# Patient Record
Sex: Male | Born: 1945 | Race: Black or African American | Hispanic: No | Marital: Married | Smoking: Never smoker
Health system: Southern US, Community
[De-identification: ages and names within clinical notes are randomized; demographics above are authoritative.]

## PROBLEM LIST (undated history)

## (undated) DIAGNOSIS — H269 Unspecified cataract: Secondary | ICD-10-CM

## (undated) DIAGNOSIS — R7989 Other specified abnormal findings of blood chemistry: Secondary | ICD-10-CM

## (undated) DIAGNOSIS — N400 Enlarged prostate without lower urinary tract symptoms: Secondary | ICD-10-CM

## (undated) DIAGNOSIS — I10 Essential (primary) hypertension: Secondary | ICD-10-CM

## (undated) DIAGNOSIS — D649 Anemia, unspecified: Secondary | ICD-10-CM

## (undated) HISTORY — DX: Other specified abnormal findings of blood chemistry: R79.89

## (undated) HISTORY — PX: JOINT REPLACEMENT: SHX530

## (undated) HISTORY — PX: REPLACEMENT TOTAL KNEE: SUR1224

## (undated) HISTORY — PX: OTHER SURGICAL HISTORY: SHX169

## (undated) HISTORY — DX: Unspecified cataract: H26.9

---

## 2007-09-26 ENCOUNTER — Encounter: Payer: Self-pay | Admitting: Emergency Medicine

## 2007-09-27 ENCOUNTER — Ambulatory Visit: Payer: Self-pay | Admitting: Cardiology

## 2007-09-27 ENCOUNTER — Encounter (INDEPENDENT_AMBULATORY_CARE_PROVIDER_SITE_OTHER): Payer: Self-pay | Admitting: Internal Medicine

## 2007-09-27 ENCOUNTER — Ambulatory Visit: Payer: Self-pay | Admitting: Vascular Surgery

## 2007-09-27 ENCOUNTER — Inpatient Hospital Stay (HOSPITAL_COMMUNITY): Admission: EM | Admit: 2007-09-27 | Discharge: 2007-09-28 | Payer: Self-pay | Admitting: Internal Medicine

## 2010-12-01 NOTE — Consult Note (Signed)
NAME:  David Nunez, David Nunez NO.:  [id number]   MEDICAL RECORD NO.:  [id number]          PATIENT TYPE:  INP   LOCATION:  1428                         FACILITY:  [facility]   PHYSICIAN:  Marlan Palau, M.D.  DATE OF BIRTH:  [birth year]   DATE OF CONSULTATION:  09/27/2007  DATE OF DISCHARGE:  09/28/2007                                 CONSULTATION   HISTORY OF PRESENT ILLNESS:  [name] is a 65 year old right-handed  black male born October 14, 1944 with a history of obesity, peptic ulcer  disease.  The patient is not followed by a primary care physician comes  in with untreated hypertension with blood pressure of 194/122.  The  patient experienced an episode of transient slurred speech around 6:30  p.m. on the day of admission.  The patient noted that he had problems  for about half an hour but the deficits completely cleared.  The patient  was brought in for further management.  An MRI scan of the brain has  shown evidence of patchy small infarcts in the left middle cerebral  distribution including the frontal parietal areas.  MRI angiogram of the  intracranial vessels shows evidence of significant atherosclerotic  changes involving the left middle cerebral artery in the distal  branches, possible 2-3 mm periophthalmic and artery aneurysm was seen on  the left.  The patient has undergone a 2-D echocardiogram that was  relatively unremarkable.  Ejection fraction was 55-60%, no left  ventricular regional wall motion abnormalities were seen.  No source of  cardiac embolus was identified.  The patient was not on aspirin prior to  coming into the hospital on a regular basis.  The patient has been noted  to have a elevated LDL in the 140 range and has been placed on Zocor  during this hospitalization.  Neurology was consulted for further  evaluation.   PAST MEDICAL HISTORY:  Significant for:  1. History of new onset left brain stroke.  2. Obesity.  3. Hypertension.  4.  Peptic ulcer disease, status post partial gastrectomy.  5. Degenerative arthritis.  6. Gastroesophageal reflux disease.   MEDICATIONS PRIOR TO ADMISSION:  Include over-the-counter Aleve and  Prilosec.   ALLERGIES:  The patient has no known allergies.  Doe not smoke or drink.   SOCIAL HISTORY:  The patient is married, lives in the [place], [place]  [place] area. Has two fully grown children who are alive and well.  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 Family medical  history notable for the fact that mother died of cancer.  Father died  with complications of hypertension and had alcoholism.  Patient has two  brothers, one sister.  Sister has breast cancer.   REVIEW OF SYSTEMS:  Notable for no recent fevers, chills.  The patient  did note a headache on the day of admission, but does not have headaches  regularly.  Denies any visual field changes problems swallowing, neck  pain.  Does have some shortness of breath, no climbing stairs denies  chest pains, abdominal pains, nausea, vomiting, troubles controlling  bowels or bladder.  Denies dizziness, blackout episodes seizures.   PHYSICAL EXAMINATION:  VITALS:  Blood pressure currently is 162/96,  heart rate 75, respiratory 20, temperature afebrile.  GENERAL:  The  patient is a markedly obese black male who is alert, cooperative at the  time examination.  HEENT:  Examination head is atraumatic.  Eyes:  Pupils equal, round, react to light.  Disks are flat bilaterally.  NECK:  Supple.  No carotid bruits noted.  RESPIRATORY:  Examination is clear.  CARDIOVASCULAR:  Patient with a regular rate and rhythm.  No obvious  murmurs, rubs noted.  EXTREMITIES:  Were without significant edema.  NEUROLOGIC:  Cranial nerves:  As above.  Facial symmetry is present.  Patient has good pinprick sensation on face, has good strength in facial  muscle, muscles to head turn, shoulder shrug bilaterally.  Extraocular  full, visual fields  are full.  Speech is well enunciated not aphasic.  Motor testing shows 5/5 strength in all fours.  Good symmetric motor and  tone is noted throughout.  Sensory testing is intact to pinprick, soft  touch, vibratory, sensation throughout.  The patient has good finger-  nose-finger, heel-to-shin.  Gait was not tested.  No drift is seen in  the arms or legs.  Deep tendon reflexes symmetric normal toes downgoing  bilaterally.Marland Kitchen  NIH stroke scale score is zero.   LABORATORY VALUES:  Notable for white count of 6.8, hemoglobin 13.1,  hematocrit 38.2, MCV of 81.2, platelets of 186, total bili of 1.2 on  alkaline phosphatase of 73, SGOT of 19, SGPT of 20, total protein of  5.7, albumin 3.2, hemoglobin A1c of 5.3, glucose 111.  Cholesterol panel  reveals total cholesterol 189, triglycerides of 99, HDL of 27, LDL of  142, VLDL of 20, TSH of 5.4, homocystine level 9.2.  Sodium 139,  potassium 3.9, chloride of 109, glucose 105, BUN of 11, creatinine 1.1.  MRI scan is as above.   IMPRESSION:  1. Left middle cerebral distribution stroke with atherosclerosis in      the left middle cerebral artery.  2. Hypertension that was untreated.  3. Obesity.   This patient's primary risk factor for stroke is his untreated  hypertension.  The patient was not on antiplatelet agent prior to  admission.  The patient is currently on full-dose Lovenox and I am not  clear what the indications are for this.  We will check MRI angiogram of  the extracranial vessels to fully evaluate the cerebrovascular  circulation.  There is a small periophthalmic aneurysm on the left that  does not at this point require any surgical management.  This should be  followed over time.  Agree with the use of Zocor at this point and  aggressive blood pressure control.  The patient will need primary care  physician upon discharge. Will follow the patient's clinical course  while in-house. Again the patient has an NIH stroke scale score of  zero  did not get t-PA due to resolution of clinical symptoms.  Thank you very  much.      Marlan Palau, M.D.  Electronically Signed     CKW/MEDQ  D:  09/27/2007  T:  09/29/2007  Job:  [id number]   cc:   [facility]  [address]  [place], [place] [address]

## 2010-12-01 NOTE — Discharge Summary (Signed)
NAME:  David Nunez, David Nunez NO.:  [id number]   MEDICAL RECORD NO.:  [id number]          PATIENT TYPE:  INP   LOCATION:  1428                         FACILITY:  [facility]   PHYSICIAN:  Marcellus Scott, MD     DATE OF BIRTH:  01-09-1946   DATE OF ADMISSION:  09/27/2007  DATE OF DISCHARGE:  09/28/2007                               DISCHARGE SUMMARY   PRIMARY MEDICAL DOCTOR:  Chiropodist - [facility] [employer].   DISCHARGE DIAGNOSES:  1. Multiple acute infarcts, left frontal and parietal lobe - left      middle cerebral artery territory.  2. Hypertensive urgency.  3. Uncontrolled hypertension.  4. Hyperlipidemia.  5. Left paraophthalmic aneurysm.  6. Mild diastolic dysfunction.  7. Obesity.   DISCHARGE MEDICATIONS:  1. Norvasc 10 mg p.o. daily.  2. Enteric coated aspirin 81 mg p.o. daily.  3. Ferrous sulfate 325 mg p.o. daily.  4. Hydrochlorothiazide 25 mg p.o. daily.  5. Labetalol 100 mg p.o. b.i.d.  6. Prilosec over-the-counter 20 mg p.o. daily.  7. Simvastatin 20 mg p.o. nightly.   PROCEDURES:  1. MRA of the neck.  Impression:      a.     Suggestion of mild stenosis at the origin of the nondominant       left vertebral artery.      b.     Wide patency at the carotid bifurcations bilaterally.      c.     Attenuation of flow signal and caliber involving the right       common carotid artery proximally just about its origin.  This       probably represents artifact related to the patient's habitus.  An       underlying atherosclerotic narrowing or other pathological       processes felt to be less likely.  2. MRI of the brain without contrast.  Impression:      a.     Several small acute nonhemorrhagic infarcts posterior left       frontal lobe and parietal lobe extending towards the junction with       a left occipital lobe.      b.     Paranasal sinus mucosal thickening and partial opacification       right mastoid air cells as noted above.      c.     No  intracranial mass noted on this unenhanced exam.  3. MRA of the head.  Impression:      a.     Prominent atherosclerotic type changes, left carotid       terminus and left middle cerebral artery bifurcation and adjusting       branch vessels.      b.     A 3.3 x 2 mm left paraophthalmic aneurysm suspected.      c.     Ectasia of the vertebral vascular system with branch vessel       atherosclerotic-type changes.  4. Echocardiogram.  Limited study due to poor acoustic windows.      Overall, left ventricular systolic  function was normal.  Left      ventricular ejection fraction estimated range between 55-60%.      Features consistent with mild diastolic dysfunction.  5. Carotid Dopplers.  Right vertebral artery flow, antegrade, left      vertebral artery not found.  Mild intimal thickening throughout      bilaterally.  No significant right or left internal carotid artery      stenosis.   LABORATORY DATA:  Homocysteine 9.2, hemoglobin A1C 5.3, TSH 5.456.  Lipid panel with cholesterol 189.  Triglycerides 99, HDL 27, LDL 142,  VLDL 20.  Hepatic panel unremarkable except for albumin of 3.2.  BNP  less than 30.  CBCs unremarkable with hemoglobin 13.1, hematocrit 38.2,  white blood cell 6.8, platelets 186.  Serum creatinine of 1.1.   CONSULTATIONS:  Neurology, Dr. Lesia Sago.   HOSPITAL COURSE/DISPOSITION:  Please refer to History and Physical note  for initial admission details.  In summary, David Nunez is a 65 year old  African American male patient with history of untreated hypertension who  presented with sudden onset of slurred speech.  On arrival to the  emergency room, he was found to have markedly elevated blood pressure of  194/122 mmHg.  He was thereby admitted with diagnosis of hypertensive  urgency, TIA versus hypertensive encephalopathy.  The patient was  admitted to the telemetry unit.  He had extensive workup as indicated  above.  1. Multiple acute infarcts left MCA  territory.  As indicated, the      patient had extensive workup as indicated above.  He was placed on      low-dose aspirin.  He speech has normalized.  He has no other      clinical neurological deficits at this time.  I have discussed his      case with Dr. Anne Hahn who indicated that the patient may be      discharged on low-dose enteric coated aspirin because of his prior      his of GI bleeds.  He is to follow up with Dr. Anne Hahn in a couple      of months time as an outpatient.  Also, for controlling his      secondary risk factors, mainly his hypertension, Dr. Anne Hahn thinks      that his strokes were secondary to his intracranial atherosclerosis      secondary to his uncontrolled hypertension.  2. Hypertensive urgency.  Improved with medications.  3. Uncontrolled hypertension.  The patient's blood pressures are in      the 150s/90s with above medications.  To consider titrating his      medications as an outpatient as tolerated.  4. Hyperlipidemia.  The patient has been started on Zocor.  He has      been advised to seek immediate medical attention if there is any      proximal muscle weakness or soreness.  5. Left paraophthalmic aneurysm. According to discussion with Dr.      Anne Hahn, the patient will have to have repeat MRA of the head on a      yearly basis, at least initially, to follow the size of the      aneurysm, and if there is no further increase, then this can be      done infrequently after that.  6. Mild diastolic dysfunction, to aim to control blood pressure.      Marcellus Scott, MD  Electronically Signed     AH/MEDQ  D:  09/28/2007  T:  09/28/2007  Job:  [id number]   cc:   [facility]  Fax: [phone]   C. Lesia Sago, M.D.  Fax: [phone]

## 2010-12-01 NOTE — H&P (Signed)
NAME:  David Nunez, David Nunez NO.:  [id number]   MEDICAL RECORD NO.:  [id number]          PATIENT TYPE:  INP   LOCATION:  1428                         FACILITY:  [facility]   PHYSICIAN:  Hillery Aldo, M.D.   DATE OF BIRTH:  16-Mar-1946   DATE OF ADMISSION:  09/27/2007  DATE OF DISCHARGE:                              HISTORY & PHYSICAL   PRIMARY CARE PHYSICIAN:  None.   CHIEF COMPLAINT:  Slurred speech.   HISTORY OF PRESENT ILLNESS:  The patient is a 65 year old male with past  medical history of untreated hypertension who presents with the sudden  onset of slurred speech.  The patient states that his slurring of speech  lasted approximately one hour and that his wife urged him to go to the  [facility] to have this evaluated.  Speech is much clearer upon  presentation to the Urgent Care Center.  His wife additionally gave him  an aspirin prior to presenting to the Urgent Care Center.  The patient  denies any history of similar symptoms.  He denies headache.  He was  feeling mildly faint when this occurred.  Denied any vision changes or  other focal neurologic deficits such as numbness or weakness.  Upon  initial evaluation in the emergency department, he is found to have a  blood pressure of 194/122 and therefore is being admitted for  hypertensive urgency and possible transient ischemic attack versus  hypertensive encephalopathy.   PAST MEDICAL HISTORY:  1. Hypertension (the patient has not been on any medication for two      years).  2. Anemia.  3. Gastroesophageal reflux disease.  4. Peptic ulcer disease status post gastric resection.  5. Osteoarthritis of the knees.   FAMILY HISTORY:  The patient's father died at age 74 from complications  of alcoholism and hypertension.  The patient's mother died at 73 from  cancer.  The patient has one sister who has breast cancer and two  brothers who are healthy.  He has two healthy children.   SOCIAL HISTORY:  The  patient is married.  He is a lifelong nonsmoker.  He denies any alcohol or drug use.  He is a Doctor, general practice.   ALLERGIES:  None.   CURRENT MEDICATIONS:  1. Aleve b.i.d.  2. Iron supplements daily.   REVIEW OF SYSTEMS:  The patient denies any fever or chills.  He does get  short of breath, but he attributes this to a recent bout of bronchitis.  He develops a cough with exertion.  He denies sputum production.  He has  two- to three-pillow orthopnea.  He denies any changes in bowel habits,  melena or hematochezia.  Denies any nausea, vomiting, or diarrhea.  He  denies dysuria.  He denies any recent changes in his energy level.   PHYSICAL EXAMINATION:  Temperature 97.3, pulse 92, respirations 20,  blood pressure current 155/102, O2 saturation 98% on room air.  GENERAL:  Obese, black male who is in no acute distress.  HEENT:  Normocephalic, atraumatic.  PERRL.  EOMI.  Oropharynx is clear.  Visual fields are full.  NECK:  Supple, no thyromegaly, no lymphadenopathy, no jugular venous  distention.  CHEST:  Lungs clear to auscultation bilaterally, diminished at the  bases.  HEART:  Regular rate, rhythm.  No murmurs, rubs, or gallops.  ABDOMEN:  Soft, nontender, nondistended with normoactive bowel sounds.  EXTREMITIES:  1+ edema bilaterally.  SKIN:  Warm and dry.  No rashes.  NEUROLOGIC:  The patient is alert and oriented x3.  Cranial nerves II-  XII are grossly intact.  Moves all extremities x4 with equal strength.   DATA REVIEW:  CT scan of the head shows no acute changes.   LABORATORY DATA:  Sodium is 139, potassium 3.9, chloride 109, bicarb  23.2, BUN 11, creatinine 1.1, glucose 105.   ASSESSMENT/PLAN:  1. Transient ischemic attack versus hypertensive encephalopathy:  We      will admit the patient to the telemetry unit and monitor him      closely.  Will check an MRI/MRA to rule out any significant      cerebrovascular disease or evidence of stroke.  Will check a two-       dimensional echocardiogram given his severe hypertension as he      likely has some element of LV hypertrophy.  Will check carotid      Dopplers.  Will start him on aspirin therapy.  Will check a fasting      lipid panel and a homocystine panel as well as a hemoglobin A1c to      further risk stratify him.  Will start him on antihypertensive      medications including hydrochlorothiazide, Norvasc, and labetalol.  2. Orthopnea:  The patient may have some element of heart failure.      Will check a BNP value, a TSH, and a two-dimensional echocardiogram      to evaluate his LV function.  3. Gastroesophageal reflux disease/history of peptic ulcer disease:      Placed the patient on Protonix for prophylaxis.  4. History of anemia:  Check the patient's CBC.  5. Prophylaxis:  Again, will initiate Protonix for GI prophylaxis and      use Lovenox for DVT prophylaxis.      Hillery Aldo, M.D.  Electronically Signed     CR/MEDQ  D:  09/26/2007  T:  09/28/2007  Job:  [id number]

## 2011-04-06 ENCOUNTER — Ambulatory Visit: Payer: Non-veteran care | Attending: Pathology | Admitting: Physical Therapy

## 2011-04-06 DIAGNOSIS — M25669 Stiffness of unspecified knee, not elsewhere classified: Secondary | ICD-10-CM | POA: Insufficient documentation

## 2011-04-06 DIAGNOSIS — IMO0001 Reserved for inherently not codable concepts without codable children: Secondary | ICD-10-CM | POA: Insufficient documentation

## 2011-04-06 DIAGNOSIS — M25569 Pain in unspecified knee: Secondary | ICD-10-CM | POA: Insufficient documentation

## 2011-04-06 DIAGNOSIS — Z96659 Presence of unspecified artificial knee joint: Secondary | ICD-10-CM | POA: Insufficient documentation

## 2011-04-12 LAB — HEPATIC FUNCTION PANEL
Albumin: 3.2 — ABNORMAL LOW
Alkaline Phosphatase: 73
Indirect Bilirubin: 1 — ABNORMAL HIGH
Total Protein: 5.7 — ABNORMAL LOW

## 2011-04-12 LAB — I-STAT 8, (EC8 V) (CONVERTED LAB)
Bicarbonate: 23.2
Chloride: 109
HCT: 44
Hemoglobin: 15
Operator id: 196461
TCO2: 24
pCO2, Ven: 32.6 — ABNORMAL LOW

## 2011-04-12 LAB — DIFFERENTIAL
Basophils Absolute: 0.1
Basophils Relative: 1
Eosinophils Absolute: 0.2
Eosinophils Relative: 2
Lymphs Abs: 1.5
Neutrophils Relative %: 67

## 2011-04-12 LAB — LIPID PANEL
Cholesterol: 189
HDL: 27 — ABNORMAL LOW
Total CHOL/HDL Ratio: 7

## 2011-04-12 LAB — HOMOCYSTEINE: Homocysteine: 9.2

## 2011-04-12 LAB — HEMOGLOBIN A1C
Hgb A1c MFr Bld: 5.3
Mean Plasma Glucose: 111

## 2011-04-12 LAB — CBC
MCV: 81.2
Platelets: 186
RDW: 15.1
WBC: 6.8

## 2011-04-12 LAB — B-NATRIURETIC PEPTIDE (CONVERTED LAB): Pro B Natriuretic peptide (BNP): <30

## 2011-04-15 ENCOUNTER — Ambulatory Visit: Payer: Non-veteran care | Admitting: Physical Therapy

## 2011-04-22 ENCOUNTER — Ambulatory Visit: Payer: Non-veteran care | Attending: Pathology | Admitting: Physical Therapy

## 2011-04-22 DIAGNOSIS — IMO0001 Reserved for inherently not codable concepts without codable children: Secondary | ICD-10-CM | POA: Insufficient documentation

## 2011-04-22 DIAGNOSIS — M25669 Stiffness of unspecified knee, not elsewhere classified: Secondary | ICD-10-CM | POA: Insufficient documentation

## 2011-04-22 DIAGNOSIS — M25569 Pain in unspecified knee: Secondary | ICD-10-CM | POA: Insufficient documentation

## 2011-04-22 DIAGNOSIS — Z96659 Presence of unspecified artificial knee joint: Secondary | ICD-10-CM | POA: Insufficient documentation

## 2011-04-28 ENCOUNTER — Ambulatory Visit: Payer: Non-veteran care | Admitting: Physical Therapy

## 2011-05-06 ENCOUNTER — Ambulatory Visit: Payer: Non-veteran care | Admitting: Physical Therapy

## 2011-05-07 ENCOUNTER — Ambulatory Visit: Payer: Non-veteran care | Admitting: Physical Therapy

## 2011-05-13 ENCOUNTER — Ambulatory Visit: Payer: Non-veteran care | Admitting: Physical Therapy

## 2011-05-20 ENCOUNTER — Ambulatory Visit: Payer: Non-veteran care | Admitting: Rehabilitation

## 2011-05-25 ENCOUNTER — Ambulatory Visit: Payer: Non-veteran care | Attending: Pathology | Admitting: Physical Therapy

## 2011-05-25 DIAGNOSIS — IMO0001 Reserved for inherently not codable concepts without codable children: Secondary | ICD-10-CM | POA: Insufficient documentation

## 2011-05-25 DIAGNOSIS — M25569 Pain in unspecified knee: Secondary | ICD-10-CM | POA: Insufficient documentation

## 2011-05-25 DIAGNOSIS — M25669 Stiffness of unspecified knee, not elsewhere classified: Secondary | ICD-10-CM | POA: Insufficient documentation

## 2011-05-25 DIAGNOSIS — Z96659 Presence of unspecified artificial knee joint: Secondary | ICD-10-CM | POA: Insufficient documentation

## 2015-09-26 ENCOUNTER — Emergency Department (HOSPITAL_COMMUNITY): Payer: Non-veteran care

## 2015-09-26 ENCOUNTER — Emergency Department (HOSPITAL_COMMUNITY)
Admission: EM | Admit: 2015-09-26 | Discharge: 2015-09-26 | Disposition: A | Discharge status: Home / Self Care | Payer: Non-veteran care | Attending: Emergency Medicine | Admitting: Emergency Medicine

## 2015-09-26 ENCOUNTER — Encounter (HOSPITAL_COMMUNITY): Payer: Self-pay | Admitting: *Deleted

## 2015-09-26 DIAGNOSIS — N400 Enlarged prostate without lower urinary tract symptoms: Secondary | ICD-10-CM | POA: Diagnosis not present

## 2015-09-26 DIAGNOSIS — R109 Unspecified abdominal pain: Secondary | ICD-10-CM

## 2015-09-26 DIAGNOSIS — N200 Calculus of kidney: Secondary | ICD-10-CM

## 2015-09-26 DIAGNOSIS — Z79899 Other long term (current) drug therapy: Secondary | ICD-10-CM | POA: Diagnosis not present

## 2015-09-26 DIAGNOSIS — N3090 Cystitis, unspecified without hematuria: Secondary | ICD-10-CM | POA: Diagnosis not present

## 2015-09-26 DIAGNOSIS — M545 Low back pain, unspecified: Secondary | ICD-10-CM

## 2015-09-26 DIAGNOSIS — I10 Essential (primary) hypertension: Secondary | ICD-10-CM | POA: Insufficient documentation

## 2015-09-26 DIAGNOSIS — Z792 Long term (current) use of antibiotics: Secondary | ICD-10-CM | POA: Insufficient documentation

## 2015-09-26 DIAGNOSIS — D649 Anemia, unspecified: Secondary | ICD-10-CM | POA: Diagnosis not present

## 2015-09-26 HISTORY — DX: Anemia, unspecified: D64.9

## 2015-09-26 HISTORY — DX: Essential (primary) hypertension: I10

## 2015-09-26 HISTORY — DX: Benign prostatic hyperplasia without lower urinary tract symptoms: N40.0

## 2015-09-26 LAB — CBC WITH DIFFERENTIAL/PLATELET
Basophils Absolute: 0.1 10*3/uL (ref 0.0–0.1)
Basophils Relative: 1 %
Eosinophils Absolute: 0.3 10*3/uL (ref 0.0–0.7)
Eosinophils Relative: 4 %
HCT: 41.2 % (ref 39.0–52.0)
Hemoglobin: 13.7 g/dL (ref 13.0–17.0)
Lymphocytes Relative: 23 %
Lymphs Abs: 1.5 10*3/uL (ref 0.7–4.0)
MCH: 24.5 pg — ABNORMAL LOW (ref 26.0–34.0)
MCHC: 33.3 g/dL (ref 30.0–36.0)
MCV: 73.6 fL — ABNORMAL LOW (ref 78.0–100.0)
Monocytes Absolute: 0.5 10*3/uL (ref 0.1–1.0)
Monocytes Relative: 7 %
Neutro Abs: 4.3 10*3/uL (ref 1.7–7.7)
Neutrophils Relative %: 65 %
Platelets: 186 10*3/uL (ref 150–400)
RBC: 5.6 MIL/uL (ref 4.22–5.81)
RDW: 22 % — ABNORMAL HIGH (ref 11.5–15.5)
WBC: 6.7 10*3/uL (ref 4.0–10.5)

## 2015-09-26 LAB — URINE MICROSCOPIC-ADD ON
RBC / HPF: NONE SEEN RBC/hpf (ref 0–5)
Squamous Epithelial / LPF: NONE SEEN

## 2015-09-26 LAB — URINALYSIS, ROUTINE W REFLEX MICROSCOPIC
Bilirubin Urine: NEGATIVE
Glucose, UA: NEGATIVE mg/dL
Hgb urine dipstick: NEGATIVE
Ketones, ur: NEGATIVE mg/dL
Nitrite: NEGATIVE
Protein, ur: NEGATIVE mg/dL
Specific Gravity, Urine: 1.015 (ref 1.005–1.030)
pH: 6.5 (ref 5.0–8.0)

## 2015-09-26 LAB — BASIC METABOLIC PANEL
Anion gap: 11 (ref 5–15)
BUN: 17 mg/dL (ref 6–20)
CO2: 25 mmol/L (ref 22–32)
Calcium: 9.3 mg/dL (ref 8.9–10.3)
Chloride: 104 mmol/L (ref 101–111)
Creatinine, Ser: 0.94 mg/dL (ref 0.61–1.24)
GFR calc Af Amer: >60 mL/min (ref >60)
GFR calc non Af Amer: >60 mL/min (ref >60)
Glucose, Bld: 102 mg/dL — ABNORMAL HIGH (ref 65–99)
Potassium: 4 mmol/L (ref 3.5–5.1)
Sodium: 140 mmol/L (ref 135–145)

## 2015-09-26 MED ORDER — KETOROLAC TROMETHAMINE 30 MG/ML IJ SOLN
30.0000 mg | Freq: Once | INTRAMUSCULAR | Status: AC
  Administered 2015-09-26: 30 mg via INTRAVENOUS
  Filled 2015-09-26: qty 1

## 2015-09-26 MED ORDER — SULFAMETHOXAZOLE-TRIMETHOPRIM 800-160 MG PO TABS: 1.0000 | ORAL_TABLET | Freq: Two times a day (BID) | ORAL | Status: AC

## 2015-09-26 NOTE — ED Notes (Signed)
PA at bedside.

## 2015-09-26 NOTE — ED Provider Notes (Signed)
CSN: [id number]     Arrival date & time 09/26/15  0247 History   First MD Initiated Contact with Patient 09/26/15 0557     Chief Complaint  Patient presents with  . Flank Pain    HPI   70 year old male presents today with left back pain. Patient reports that he was seen for this back pain approximate 4 days ago at the [facility]. He notes the pain is sharp in nature and located in the left lower lumbar region, worse with flexion of the lower spine tenderness to palpation. He had a urinalysis that showed urinary tract infection, and outpatient CT scan. He notes that he was called by the nurse from the [facility] and told that he had a left-sided kidney stone that was "large". Instructed him to continue using his Cipro and follow up with urology 4 days from the time of this note. Patient reports symptoms have persisted, and was concerned that the urology follow-up was not soon enough. At the time of evaluation patient notes continued pain, denies any loss of distal sensation strength or motor function, no red flags for back pain. Patient denies any urinary complaints at this time. He denies any fever, chills, nausea, vomiting, abdominal pain. Patient received another refill of his Cipro and has approximate 4 days left on this.   Past Medical History  Diagnosis Date  . Hypertension   . Anemia   . BPH (benign prostatic hyperplasia)    Past Surgical History  Procedure Laterality Date  . Joint replacement      let knee replacement   No family history on file. Social History  Substance Use Topics  . Smoking status: Never Smoker   . Smokeless tobacco: None  . Alcohol Use: No    Review of Systems  All other systems reviewed and are negative.    Allergies  Review of patient's allergies indicates no known allergies.  Home Medications   Prior to Admission medications   Medication Sig Start Date End Date Taking? Authorizing Provider  amLODipine (NORVASC) 10 MG tablet Take 10 mg by mouth daily.   Yes  Historical Provider, MD  ciprofloxacin (CIPRO) 500 MG tablet Take 500 mg by mouth 2 (two) times daily.   Yes Historical Provider, MD  cyanocobalamin 500 MCG tablet Take 500 mcg by mouth daily.   Yes Historical Provider, MD  ferrous sulfate 325 (65 FE) MG tablet Take 325 mg by mouth daily with breakfast.   Yes Historical Provider, MD  hydrochlorothiazide (HYDRODIURIL) 25 MG tablet Take 25 mg by mouth daily.   Yes Historical Provider, MD  meloxicam (MOBIC) 7.5 MG tablet Take 7.5 mg by mouth 2 (two) times daily as needed for pain.   Yes Historical Provider, MD  omeprazole (PRILOSEC) 20 MG capsule Take 20 mg by mouth daily.   Yes Historical Provider, MD  terazosin (HYTRIN) 2 MG capsule Take 2 mg by mouth at bedtime.   Yes Historical Provider, MD  sulfamethoxazole-trimethoprim (BACTRIM DS,SEPTRA DS) 800-160 MG tablet Take 1 tablet by mouth 2 (two) times daily. 09/26/15 10/03/15  Julie Paolini, PA-C   BP 148/91 mmHg  Pulse 58  Temp(Src) 97.5 F (36.4 C) (Oral)  Resp 18  SpO2 98%   Physical Exam  Constitutional: He is oriented to person, place, and time. He appears well-developed and well-nourished. No distress.  HENT:  Head: Normocephalic and atraumatic.  Eyes: Conjunctivae are normal. Pupils are equal, round, and reactive to light. Right eye exhibits no discharge. Left eye exhibits no discharge.  No scleral icterus.  Neck: Normal range of motion. Neck supple. No JVD present. No tracheal deviation present.  Cardiovascular: Normal rate, regular rhythm, normal heart sounds and intact distal pulses.   Pulmonary/Chest: Effort normal. No stridor.  Abdominal: Soft. He exhibits no distension. There is no tenderness. There is no rebound, no guarding and no CVA tenderness.  Musculoskeletal: Normal range of motion. He exhibits tenderness. He exhibits no edema.  No C, T, or L spine tenderness to palpation. No obvious signs of trauma, deformity, infection, step-offs. Lung expansion normal. No scoliosis or  kyphosis. Bilateral lower extremity strength 5 out of 5, sensation grossly intact, patellar reflexes 2+, pedal pulses 2+, Refill less than 3 seconds.  Tenderness to palpation of the left lower lumbar soft tissue  Straight leg negative   Neurological: He is alert and oriented to person, place, and time. Coordination normal.  Skin: Skin is warm and dry. He is not diaphoretic.  Psychiatric: He has a normal mood and affect. His behavior is normal. Judgment and thought content normal.  Nursing note and vitals reviewed.   ED Course  Procedures (including critical care time) Labs Review Labs Reviewed  URINALYSIS, ROUTINE W REFLEX MICROSCOPIC (NOT AT [facility]) - Abnormal; Notable for the following:    Leukocytes, UA MODERATE (*)    All other components within normal limits  CBC WITH DIFFERENTIAL/PLATELET - Abnormal; Notable for the following:    MCV 73.6 (*)    MCH 24.5 (*)    RDW 22.0 (*)    All other components within normal limits  BASIC METABOLIC PANEL - Abnormal; Notable for the following:    Glucose, Bld 102 (*)    All other components within normal limits  URINE MICROSCOPIC-ADD ON - Abnormal; Notable for the following:    Bacteria, UA FEW (*)    All other components within normal limits  URINE CULTURE    Imaging Review Ct Renal Stone Study  09/26/2015  CLINICAL DATA:  Left flank pain for several days. Was [facility] at [facility] that he had a urinary tract infection and left kidney stone. Pain is increasing. EXAM: CT ABDOMEN AND PELVIS WITHOUT CONTRAST TECHNIQUE: Multidetector CT imaging of the abdomen and pelvis was performed following the standard protocol without IV contrast. COMPARISON:  None. FINDINGS: Atelectasis in the lung bases. Coronary artery calcifications. Surgical clips at the EG junction. Several punctate size stones are demonstrated in the right kidney. No hydronephrosis or hydroureter of either kidney. No ureteral stones or bladder stones. Bladder wall is  diffusely thickened which may indicate cystitis. In the right kidney there is a focal low-attenuation lesion along the mid pole measuring about 1.5 cm diameter. This is difficult to characterize on noncontrast imaging and is indeterminate. This could represent a cyst, focal pyelonephritis, or mass lesion. Consider further evaluation with contrast-enhanced CT. Cholelithiasis without evidence of gallbladder inflammation. Unenhanced appearance of the spleen, pancreas, adrenal glands, abdominal aorta, inferior vena cava, and retroperitoneal lymph nodes is unremarkable. Stomach, small bowel, and colon are not abnormally distended. No free air or free fluid in the abdomen. Pelvis: Prostate gland is enlarged, measuring 5.2 cm diameter. No free or loculated pelvic fluid collections. No pelvic lymphadenopathy. Appendix is normal. Left inguinal hernia containing fat. Degenerative changes in the spine. IMPRESSION: Nonobstructive stones in the right kidney. No hydronephrosis or hydroureter. Bladder wall is mildly thickened possibly indicating cystitis. Indeterminate 1.5 cm diameter low-attenuation lesion in the right kidney. Consider further evaluation with contrast-enhanced CT. Cholelithiasis. Prostate gland enlarged. Electronically Signed  By: Burman Nieves M.D.   On: 09/26/2015 07:03   I have personally reviewed and evaluated these images and lab results as part of my medical decision-making.   EKG Interpretation None      MDM   Final diagnoses:  Flank pain  Cystitis  Kidney stones  Left-sided low back pain without sciatica    Labs: Urinalysis, CBC, BMP- moderate leukocytes, few bacteria WBC 6-30  Imaging: CT shows nonobstructing stones in the right kidney no signs of hydronephrosis or ureter. Patient does have signs consistent with cystitis.  Consults:  Therapeutics:  Discharge Meds:   Assessment/Plan: Patient's presentation is likely musculoskeletal back pain. He is tenderness to palpation,  no red flags for back pain. I believe the majority of patient's findings are incidental. Patient has been taking Cipro for the last 10 days, with still UA positive for urinary tract infection. Patient has signs consistent with cystitis on CT scan, urine culture sent. Patient will be informed of appropriate antibiotic at that time. The patient was read word for word findings on CT exam including 1.5 cm diameter low attenuation lesion in the right kidney, [facility]. Patient was feeling significantly better after Toradol here in the ED. He has reassuring vital signs, no acute distress, he will be discharged home with strict return precautions and urology follow-up. Patient verbalized understanding and agreement today's plan had no further questions or concerns at time of discharge.         Eyvonne Mechanic, PA-C 09/26/15 1645  Cy Blamer, MD 09/26/15 [phone]

## 2015-09-26 NOTE — ED Notes (Signed)
Delay in medication administration due to loss of IV access

## 2015-09-26 NOTE — ED Notes (Signed)
Pt in CT.

## 2015-09-26 NOTE — ED Notes (Signed)
Pt states that he has been having left flank pain for several days; pt states that he was seen on Tues at [place]; pt states that he was advised he had a UTI and then pt states that they called today and advised that he had a kidney stone to the left kidney; pt states that he has had increased left pain making it difficult to stand up; pt denies urinary sx

## 2015-09-26 NOTE — Discharge Instructions (Signed)
Pt states understanding of medications, and condition. Expressed he had no questions/concerns

## 2015-09-27 LAB — URINE CULTURE
Culture: 1000
Special Requests: NORMAL

## 2016-01-13 ENCOUNTER — Ambulatory Visit: Payer: Non-veteran care | Admitting: Physical Therapy

## 2020-01-12 ENCOUNTER — Emergency Department (HOSPITAL_COMMUNITY): Payer: No Typology Code available for payment source

## 2020-01-12 ENCOUNTER — Other Ambulatory Visit: Payer: Self-pay

## 2020-01-12 ENCOUNTER — Inpatient Hospital Stay (HOSPITAL_COMMUNITY)
Admission: AD | Admit: 2020-01-12 | Discharge: 2020-01-21 | DRG: 415 | Disposition: A | Discharge status: Home / Self Care | Payer: No Typology Code available for payment source | Attending: General Surgery | Admitting: General Surgery

## 2020-01-12 ENCOUNTER — Encounter (HOSPITAL_COMMUNITY): Payer: Self-pay | Admitting: Emergency Medicine

## 2020-01-12 DIAGNOSIS — Z8711 Personal history of peptic ulcer disease: Secondary | ICD-10-CM | POA: Diagnosis not present

## 2020-01-12 DIAGNOSIS — R062 Wheezing: Secondary | ICD-10-CM

## 2020-01-12 DIAGNOSIS — K8000 Calculus of gallbladder with acute cholecystitis without obstruction: Secondary | ICD-10-CM | POA: Diagnosis present

## 2020-01-12 DIAGNOSIS — T148XXA Other injury of unspecified body region, initial encounter: Secondary | ICD-10-CM | POA: Diagnosis not present

## 2020-01-12 DIAGNOSIS — H547 Unspecified visual loss: Secondary | ICD-10-CM | POA: Diagnosis present

## 2020-01-12 DIAGNOSIS — H43399 Other vitreous opacities, unspecified eye: Secondary | ICD-10-CM | POA: Diagnosis not present

## 2020-01-12 DIAGNOSIS — Z6841 Body Mass Index (BMI) 40.0 and over, adult: Secondary | ICD-10-CM

## 2020-01-12 DIAGNOSIS — K8001 Calculus of gallbladder with acute cholecystitis with obstruction: Secondary | ICD-10-CM | POA: Diagnosis present

## 2020-01-12 DIAGNOSIS — N179 Acute kidney failure, unspecified: Secondary | ICD-10-CM | POA: Diagnosis present

## 2020-01-12 DIAGNOSIS — Z20822 Contact with and (suspected) exposure to covid-19: Secondary | ICD-10-CM | POA: Diagnosis present

## 2020-01-12 DIAGNOSIS — Z792 Long term (current) use of antibiotics: Secondary | ICD-10-CM | POA: Diagnosis not present

## 2020-01-12 DIAGNOSIS — K819 Cholecystitis, unspecified: Secondary | ICD-10-CM | POA: Diagnosis present

## 2020-01-12 DIAGNOSIS — T8089XA Other complications following infusion, transfusion and therapeutic injection, initial encounter: Secondary | ICD-10-CM | POA: Diagnosis not present

## 2020-01-12 DIAGNOSIS — I728 Aneurysm of other specified arteries: Secondary | ICD-10-CM | POA: Diagnosis present

## 2020-01-12 DIAGNOSIS — Z87442 Personal history of urinary calculi: Secondary | ICD-10-CM

## 2020-01-12 DIAGNOSIS — D649 Anemia, unspecified: Secondary | ICD-10-CM | POA: Diagnosis present

## 2020-01-12 DIAGNOSIS — K660 Peritoneal adhesions (postprocedural) (postinfection): Secondary | ICD-10-CM | POA: Diagnosis present

## 2020-01-12 DIAGNOSIS — J90 Pleural effusion, not elsewhere classified: Secondary | ICD-10-CM

## 2020-01-12 DIAGNOSIS — Z791 Long term (current) use of non-steroidal anti-inflammatories (NSAID): Secondary | ICD-10-CM

## 2020-01-12 DIAGNOSIS — Z79899 Other long term (current) drug therapy: Secondary | ICD-10-CM

## 2020-01-12 DIAGNOSIS — R112 Nausea with vomiting, unspecified: Secondary | ICD-10-CM

## 2020-01-12 DIAGNOSIS — R197 Diarrhea, unspecified: Secondary | ICD-10-CM | POA: Diagnosis present

## 2020-01-12 DIAGNOSIS — N400 Enlarged prostate without lower urinary tract symptoms: Secondary | ICD-10-CM | POA: Diagnosis present

## 2020-01-12 DIAGNOSIS — Z903 Acquired absence of stomach [part of]: Secondary | ICD-10-CM | POA: Diagnosis not present

## 2020-01-12 DIAGNOSIS — M7989 Other specified soft tissue disorders: Secondary | ICD-10-CM | POA: Diagnosis not present

## 2020-01-12 DIAGNOSIS — H53129 Transient visual loss, unspecified eye: Secondary | ICD-10-CM | POA: Diagnosis not present

## 2020-01-12 DIAGNOSIS — H53133 Sudden visual loss, bilateral: Secondary | ICD-10-CM | POA: Diagnosis not present

## 2020-01-12 DIAGNOSIS — I10 Essential (primary) hypertension: Secondary | ICD-10-CM | POA: Diagnosis present

## 2020-01-12 DIAGNOSIS — Z96652 Presence of left artificial knee joint: Secondary | ICD-10-CM | POA: Diagnosis present

## 2020-01-12 DIAGNOSIS — Z8673 Personal history of transient ischemic attack (TIA), and cerebral infarction without residual deficits: Secondary | ICD-10-CM | POA: Diagnosis not present

## 2020-01-12 DIAGNOSIS — K810 Acute cholecystitis: Secondary | ICD-10-CM

## 2020-01-12 DIAGNOSIS — H53139 Sudden visual loss, unspecified eye: Secondary | ICD-10-CM | POA: Diagnosis not present

## 2020-01-12 LAB — URINALYSIS, ROUTINE W REFLEX MICROSCOPIC
Bacteria, UA: NONE SEEN
Bilirubin Urine: NEGATIVE
Glucose, UA: NEGATIVE mg/dL
Ketones, ur: NEGATIVE mg/dL
Nitrite: NEGATIVE
Protein, ur: 30 mg/dL — AB
Specific Gravity, Urine: 1.028 (ref 1.005–1.030)
pH: 5 (ref 5.0–8.0)

## 2020-01-12 LAB — COMPREHENSIVE METABOLIC PANEL
ALT: 16 U/L (ref 0–44)
AST: 17 U/L (ref 15–41)
Albumin: 4 g/dL (ref 3.5–5.0)
Alkaline Phosphatase: 95 U/L (ref 38–126)
Anion gap: 11 (ref 5–15)
BUN: 10 mg/dL (ref 8–23)
CO2: 25 mmol/L (ref 22–32)
Calcium: 9.5 mg/dL (ref 8.9–10.3)
Chloride: 99 mmol/L (ref 98–111)
Creatinine, Ser: 0.99 mg/dL (ref 0.61–1.24)
GFR calc Af Amer: >60 mL/min (ref >60)
GFR calc non Af Amer: >60 mL/min (ref >60)
Glucose, Bld: 106 mg/dL — ABNORMAL HIGH (ref 70–99)
Potassium: 4 mmol/L (ref 3.5–5.1)
Sodium: 135 mmol/L (ref 135–145)
Total Bilirubin: 3 mg/dL — ABNORMAL HIGH (ref 0.3–1.2)
Total Protein: 7.8 g/dL (ref 6.5–8.1)

## 2020-01-12 LAB — CBC
HCT: 47.7 % (ref 39.0–52.0)
Hemoglobin: 16.1 g/dL (ref 13.0–17.0)
MCH: 27.2 pg (ref 26.0–34.0)
MCHC: 33.8 g/dL (ref 30.0–36.0)
MCV: 80.7 fL (ref 80.0–100.0)
Platelets: 178 10*3/uL (ref 150–400)
RBC: 5.91 MIL/uL — ABNORMAL HIGH (ref 4.22–5.81)
RDW: 14.4 % (ref 11.5–15.5)
WBC: 24.9 10*3/uL — ABNORMAL HIGH (ref 4.0–10.5)
nRBC: 0 % (ref 0.0–0.2)

## 2020-01-12 LAB — LIPASE, BLOOD: Lipase: 17 U/L (ref 11–51)

## 2020-01-12 LAB — SARS CORONAVIRUS 2 BY RT PCR (HOSPITAL ORDER, PERFORMED IN ~~LOC~~ HOSPITAL LAB): SARS Coronavirus 2: NEGATIVE

## 2020-01-12 LAB — LACTIC ACID, PLASMA
Lactic Acid, Venous: 1.7 mmol/L (ref 0.5–1.9)
Lactic Acid, Venous: 1.3 mmol/L (ref 0.5–1.9)

## 2020-01-12 MED ORDER — HYDROMORPHONE HCL 1 MG/ML IJ SOLN
1.0000 mg | INTRAMUSCULAR | Status: DC | PRN
  Administered 2020-01-14: 1 mg via INTRAVENOUS
  Filled 2020-01-12 (×2): qty 1

## 2020-01-12 MED ORDER — MORPHINE SULFATE (PF) 4 MG/ML IV SOLN
4.0000 mg | Freq: Once | INTRAVENOUS | Status: AC
  Administered 2020-01-12: 4 mg via INTRAVENOUS
  Filled 2020-01-12: qty 1

## 2020-01-12 MED ORDER — PIPERACILLIN-TAZOBACTAM 3.375 G IVPB 30 MIN
3.3750 g | Freq: Once | INTRAVENOUS | Status: AC
  Administered 2020-01-12: 3.375 g via INTRAVENOUS
  Filled 2020-01-12: qty 50

## 2020-01-12 MED ORDER — PIPERACILLIN-TAZOBACTAM 3.375 G IVPB
3.3750 g | Freq: Three times a day (TID) | INTRAVENOUS | Status: DC
  Administered 2020-01-12 – 2020-01-21 (×25): 3.375 g via INTRAVENOUS
  Filled 2020-01-12 (×22): qty 50

## 2020-01-12 MED ORDER — ACETAMINOPHEN 650 MG RE SUPP: 650.0000 mg | Freq: Four times a day (QID) | RECTAL | Status: DC | PRN

## 2020-01-12 MED ORDER — PANTOPRAZOLE SODIUM 40 MG IV SOLR
40.0000 mg | Freq: Every day | INTRAVENOUS | Status: DC
  Administered 2020-01-12 – 2020-01-19 (×8): 40 mg via INTRAVENOUS
  Filled 2020-01-12 (×8): qty 40

## 2020-01-12 MED ORDER — SODIUM CHLORIDE 0.9 % IV BOLUS
1000.0000 mL | Freq: Once | INTRAVENOUS | Status: AC
  Administered 2020-01-12: 1000 mL via INTRAVENOUS

## 2020-01-12 MED ORDER — ONDANSETRON HCL 4 MG/2ML IJ SOLN
4.0000 mg | Freq: Once | INTRAMUSCULAR | Status: AC
  Administered 2020-01-12: 4 mg via INTRAVENOUS
  Filled 2020-01-12: qty 2

## 2020-01-12 MED ORDER — SODIUM CHLORIDE 0.45 % IV SOLN: INTRAVENOUS | Status: DC

## 2020-01-12 MED ORDER — ONDANSETRON 4 MG PO TBDP: 4.0000 mg | ORAL_TABLET | Freq: Once | ORAL | Status: DC | PRN

## 2020-01-12 MED ORDER — SODIUM CHLORIDE 0.9% FLUSH
3.0000 mL | Freq: Once | INTRAVENOUS | Status: AC
  Administered 2020-01-12: 3 mL via INTRAVENOUS

## 2020-01-12 MED ORDER — ONDANSETRON 4 MG PO TBDP: 4.0000 mg | ORAL_TABLET | Freq: Four times a day (QID) | ORAL | Status: DC | PRN

## 2020-01-12 MED ORDER — OXYCODONE HCL 5 MG PO TABS
5.0000 mg | ORAL_TABLET | ORAL | Status: DC | PRN
  Administered 2020-01-13 (×5): 5 mg via ORAL
  Administered 2020-01-14 (×2): 10 mg via ORAL
  Administered 2020-01-14: 5 mg via ORAL
  Administered 2020-01-16: 10 mg via ORAL
  Administered 2020-01-16: 5 mg via ORAL
  Filled 2020-01-12 (×4): qty 2
  Filled 2020-01-12 (×3): qty 1
  Filled 2020-01-12: qty 2
  Filled 2020-01-12 (×2): qty 1

## 2020-01-12 MED ORDER — ONDANSETRON HCL 4 MG/2ML IJ SOLN
4.0000 mg | Freq: Four times a day (QID) | INTRAMUSCULAR | Status: DC | PRN
  Administered 2020-01-12 – 2020-01-17 (×3): 4 mg via INTRAVENOUS
  Filled 2020-01-12 (×3): qty 2

## 2020-01-12 MED ORDER — IOHEXOL 300 MG/ML  SOLN
100.0000 mL | Freq: Once | INTRAMUSCULAR | Status: AC | PRN
  Administered 2020-01-12: 100 mL via INTRAVENOUS

## 2020-01-12 MED ORDER — SODIUM CHLORIDE (PF) 0.9 % IJ SOLN
INTRAMUSCULAR | Status: AC
  Filled 2020-01-12: qty 50

## 2020-01-12 MED ORDER — ACETAMINOPHEN 325 MG PO TABS
650.0000 mg | ORAL_TABLET | Freq: Four times a day (QID) | ORAL | Status: DC | PRN
  Administered 2020-01-17: 650 mg via ORAL
  Filled 2020-01-12: qty 2

## 2020-01-12 NOTE — H&P (Addendum)
David Nunez is an 74 y.o. male.    General Surgery [facility]  Chief Complaint: abdominal pain  HPI: Patient is a 74 year old male who presents to the emergency department with 3-day history of abdominal pain, nausea, and emesis.  Patient has had chills.  Patient indicates pain in the upper and mid abdomen.  He states he has not had any episodes of pain similar to this.  He denies any history of hepatobiliary or pancreatic disease.  He denies any history of jaundice or acholic stools.  Previous abdominal surgery for partial gastrectomy for peptic ulcer disease approximately 25 years ago.  In the emergency department the patient is noted to have an elevated white blood cell count of 24.9.  Total bilirubin elevated at 3.0, LFT's otherwise normal. Patient underwent CT scan of the abdomen pelvis with findings consistent with acute cholecystitis and cholelithiasis.  General surgery is called for evaluation and management.  Previous abdominal surgery as noted above for treatment of ulcer disease with partial gastrectomy.  Details are not available.  No other abdominal surgery.  Patient is retired from [employer].  He then worked in the [employer].  He is now retired.  Past Medical History:  Diagnosis Date  . Anemia   . BPH (benign prostatic hyperplasia)   . Hypertension     Past Surgical History:  Procedure Laterality Date  . JOINT REPLACEMENT     let knee replacement    History reviewed. No pertinent family history. Social History:  reports that he has never smoked. He has never used smokeless tobacco. He reports that he does not drink alcohol and does not use drugs.  Allergies: No Known Allergies  (Not in a hospital admission)   Results for orders placed or performed during the hospital encounter of 01/12/20 (from the past 48 hour(s))  Lipase, blood     Status: None   Collection Time: 01/12/20 12:09 PM  Result Value Ref Range   Lipase 17 11 - 51 U/L     Comment: Performed at [facility], [address] [place], [place] [address]  Comprehensive metabolic panel     Status: Abnormal   Collection Time: 01/12/20 12:09 PM  Result Value Ref Range   Sodium 135 135 - 145 mmol/L   Potassium 4.0 3.5 - 5.1 mmol/L   Chloride 99 98 - 111 mmol/L   CO2 25 22 - 32 mmol/L   Glucose, Bld 106 (H) 70 - 99 mg/dL    Comment: Glucose reference range applies only to samples taken after fasting for at least 8 hours.   BUN 10 8 - 23 mg/dL   Creatinine, Ser 6.64 0.61 - 1.24 mg/dL   Calcium 9.5 8.9 - 40.3 mg/dL   Total Protein 7.8 6.5 - 8.1 g/dL   Albumin 4.0 3.5 - 5.0 g/dL   AST 17 15 - 41 U/L   ALT 16 0 - 44 U/L   Alkaline Phosphatase 95 38 - 126 U/L   Total Bilirubin 3.0 (H) 0.3 - 1.2 mg/dL   GFR calc non Af Amer >60 >60 mL/min   GFR calc Af Amer >60 >60 mL/min   Anion gap 11 5 - 15    Comment: Performed at [facility], [address], [place], [place] [address]  CBC     Status: Abnormal   Collection Time: 01/12/20 12:09 PM  Result Value Ref Range   WBC 24.9 (H) 4.0 - 10.5 K/uL  RBC 5.91 (H) 4.22 - 5.81 MIL/uL   Hemoglobin 16.1 13.0 - 17.0 g/dL   HCT 47.7 39 - 52 %   MCV 80.7 80.0 - 100.0 fL   MCH 27.2 26.0 - 34.0 pg   MCHC 33.8 30.0 - 36.0 g/dL   RDW 14.4 11.5 - 15.5 %   Platelets 178 150 - 400 K/uL   nRBC 0.0 0.0 - 0.2 %    Comment: Performed at [facility], [place] [address], [place], [place] [address]   CT ABDOMEN PELVIS W CONTRAST  Result Date: 01/12/2020 CLINICAL DATA:  Left upper quadrant abdominal pain and right lower quadrant abdominal pain for the past 3 days with vomiting. Possible food poisoning. EXAM: CT ABDOMEN AND PELVIS WITH CONTRAST TECHNIQUE: Multidetector CT imaging of the abdomen and pelvis was performed using the standard protocol following bolus administration of intravenous contrast. CONTRAST:  180mL OMNIPAQUE IOHEXOL 300 MG/ML  SOLN COMPARISON:  09/26/2015 FINDINGS:  Lower chest: Mild bibasilar linear atelectasis/scarring. Borderline enlarged heart. Coronary artery and aortic calcifications. Hepatobiliary: Small calcified granuloma in the liver. Mild intrahepatic biliary ductal dilatation. Interval dilated gallbladder with diffuse wall thickening and interval pericholecystic edema. Multiple small gallstones in the gallbladder measuring up to 8 mm in maximum diameter each. Probable small gallstone in the gallbladder neck on image number 38 series 2. The common duct is not dilated and no common duct stones are visualized. Pancreas: Mild diffuse pancreatic atrophy. Spleen: Normal in size without focal abnormality. Adrenals/Urinary Tract: Again demonstrated is a tiny mid right renal calculus. A right renal benign appearing cyst is again demonstrated as well as a small benign appearing left renal cyst. No bladder or ureteral calculi and no hydronephrosis. Unremarkable adrenal glands. Stomach/Bowel: Surgical clips in the region of the gastroesophageal junction. Unremarkable stomach, small bowel, colon and appendix. Vascular/Lymphatic: Atheromatous arterial calcifications without aneurysm. No enlarged lymph nodes. Reproductive: Moderately enlarged prostate gland. Other: Small to moderate-sized left inguinal hernia containing fat. Musculoskeletal: Lumbar and lower thoracic spine degenerative changes. IMPRESSION: 1. Cholelithiasis with a probable small gallstone lodged in the gallbladder neck with associated interval acute cholecystitis. 2. Mild intrahepatic biliary ductal dilatation, most likely due to obstruction by the changes of acute cholecystitis with dilatation of the gallbladder. 3. Stable tiny, nonobstructing right renal calculus. 4. Moderately enlarged prostate gland. 5. Small to moderate-sized left inguinal hernia containing fat. 6.  Calcific coronary artery and aortic atherosclerosis. Aortic Atherosclerosis (ICD10-I70.0). Electronically Signed   By: Claudie Revering M.D.   On:  01/12/2020 15:59    Review of Systems  Constitutional: Positive for appetite change and chills.  HENT: Negative.   Eyes: Negative.   Respiratory: Negative.   Cardiovascular: Negative.   Gastrointestinal: Positive for abdominal pain, nausea and vomiting.  Endocrine: Negative.   Genitourinary: Negative.   Musculoskeletal: Negative.   Skin: Negative.   Allergic/Immunologic: Negative.   Neurological: Negative.   Hematological: Negative.   Psychiatric/Behavioral: Negative.     Blood pressure (!) 152/71, pulse 81, temperature 99.3 F (37.4 C), temperature source Oral, resp. rate 18, height 5\' 8"  (1.727 m), weight 123.8 kg, SpO2 96 %.  CONSTITUTIONAL: NAD; conversant; no obvious deformities EYES:   Moist conjunctiva; no lid lag; anicteric; PERRL NECK:   Trachea midline; no thyromegaly LUNGS:  Normal respiratory effort; no wheeze; no rales; no tactile fremitus CV:   RRR; no palpable thrills; mild edema bilat LE's GI:    Abdomen with mild distension; well-healed upper midline incision without herniation; BS present; tender to palpation  RUQ without mass; no palpable hepatosplenomegaly MSK:    Normal range of motion of extremities; no clubbing; no cyanosis PSYCHIATRIC:  Appropriate affect for situation; alert and oriented X 3 LYMPHATIC:   No palpable cervical lymphadenopathy; no palpable axillary adenopathy    Assessment/Plan Acute cholecystitis, cholelithiasis  Admit to surgical service  IV Zosyn started in ER - will continue  Allow clear liquid diet  Repeat labs in AM 6/27 - check WBC and TBili  Likely cholecystectomy when clinically improved  Patient and I discussed the indications for cholecystectomy during this hospitalization.  We discussed initial treatment with intravenous antibiotics and bowel rest.  We discussed his past surgical history.  We discussed the technique of surgery being laparoscopic versus open depending on the extent of adhesions and scarring from his prior  abdominal surgery.  Patient understands and agrees with plans to be admitted, treated initially of antibiotics, and to likely undergo cholecystectomy during this hospitalization.  Darnell Level, MD [facility] Office: [phone]    Darnell Level, MD 01/12/2020, 4:41 PM

## 2020-01-12 NOTE — ED Provider Notes (Signed)
Medical screening examination/treatment/procedure(s) were conducted as a shared visit with non-physician practitioner(s) and myself.  I personally evaluated the patient during the encounter.    Patient history of 2 to 3 days of abdominal pain and associated vomiting and diarrhea.  Symptoms started after eating chili from [facility].  No fever, no chest pain, no shortness of breath.  Patient denies significant swelling in the legs.   Patient is alert appropriate.  Mental status clear.  No respiratory stress at rest.  Central obesity.  Heart regular.  Lungs are clear.  No peripheral edema.  CT scan shows gallbladder inflammation consistent with cholecystitis.  Patient also has significant leukocytosis.  Agree with plan of management.   Arby Barrette, MD 01/12/20 1714

## 2020-01-12 NOTE — ED Notes (Signed)
Called lab again to verify lactic was being processed. No result yet.

## 2020-01-12 NOTE — ED Provider Notes (Signed)
David Nunez is a 74 y.o. male, presenting to the ED with abdominal pain, nausea, vomiting, and loose stools for the last couple days.   HPI from Rhea Bleacher, PA-C: "Patient with history of abdominal surgery for a stomach ulcer (unable to tell me if this was due to a perforation), history of hypertension, kidney stones --presents to the emergency department with 2 to 3-day history of abdominal pain, vomiting, and diarrhea. This started after eating chili from [facility]. Patient was initially concerned that he had contracted food poisoning. Patient last had dry heaves this morning and reports an episode of bilious vomiting yesterday. He reports one episode of soft watery stool per day without blood noted. Pain is mainly in the upper abdomen. He uses Aleve sparingly, no routine use. Denies alcohol use. No fevers, chest pain, shortness of breath. No urinary tract infection symptoms. Patient has not eaten solid food in several days. Earlier today he had Jell-O and Gatorade."   Past Medical History:  Diagnosis Date  . Anemia   . BPH (benign prostatic hyperplasia)   . Hypertension     Physical Exam  BP (!) 154/90   Pulse 80   Temp 99.3 F (37.4 C) (Oral)   Resp 18   Ht 5\' 8"  (1.727 m)   Wt 123.8 kg   SpO2 93%   BMI 41.51 kg/m   Physical Exam Vitals and nursing note reviewed.  Constitutional:      General: He is not in acute distress.    Appearance: He is well-developed. He is not diaphoretic.  HENT:     Head: Normocephalic and atraumatic.     Mouth/Throat:     Mouth: Mucous membranes are moist.     Pharynx: Oropharynx is clear.  Eyes:     Conjunctiva/sclera: Conjunctivae normal.  Cardiovascular:     Rate and Rhythm: Normal rate and regular rhythm.     Pulses: Normal pulses.          Radial pulses are 2+ on the right side and 2+ on the left side.       Posterior tibial pulses are 2+ on the right side and 2+ on the left side.     Heart sounds: Normal heart sounds.     Comments:  Tactile temperature in the extremities appropriate and equal bilaterally. Pulmonary:     Effort: Pulmonary effort is normal. No respiratory distress.     Breath sounds: Normal breath sounds.  Abdominal:     Palpations: Abdomen is soft.     Tenderness: There is abdominal tenderness. There is no guarding.    Musculoskeletal:     Cervical back: Neck supple.     Right lower leg: 1+ Pitting Edema present.     Left lower leg: 1+ Pitting Edema present.  Lymphadenopathy:     Cervical: No cervical adenopathy.  Skin:    General: Skin is warm and dry.  Neurological:     Mental Status: He is alert.  Psychiatric:        Mood and Affect: Mood and affect normal.        Speech: Speech normal.        Behavior: Behavior normal.     ED Course/Procedures     Procedures  Abnormal Labs Reviewed  COMPREHENSIVE METABOLIC PANEL - Abnormal; Notable for the following components:      Result Value   Glucose, Bld 106 (*)    Total Bilirubin 3.0 (*)    All other components within normal limits  CBC - Abnormal; Notable for the following components:   WBC 24.9 (*)    RBC 5.91 (*)    All other components within normal limits    CT ABDOMEN PELVIS W CONTRAST  Result Date: 01/12/2020 CLINICAL DATA:  Left upper quadrant abdominal pain and right lower quadrant abdominal pain for the past 3 days with vomiting. Possible food poisoning. EXAM: CT ABDOMEN AND PELVIS WITH CONTRAST TECHNIQUE: Multidetector CT imaging of the abdomen and pelvis was performed using the standard protocol following bolus administration of intravenous contrast. CONTRAST:  OMNIPAQUE IOHEXOL 300 MG/ML  SOLN COMPARISON:  09/26/2015 FINDINGS: Lower chest: Mild bibasilar linear atelectasis/scarring. Borderline enlarged heart. Coronary artery and aortic calcifications. Hepatobiliary: Small calcified granuloma in the liver. Mild intrahepatic biliary ductal dilatation. Interval dilated gallbladder with diffuse wall thickening and interval  pericholecystic edema. Multiple small gallstones in the gallbladder measuring up to 8 mm in maximum diameter each. Probable small gallstone in the gallbladder neck on image number 38 series 2. The common duct is not dilated and no common duct stones are visualized. Pancreas: Mild diffuse pancreatic atrophy. Spleen: Normal in size without focal abnormality. Adrenals/Urinary Tract: Again demonstrated is a tiny mid right renal calculus. A right renal benign appearing cyst is again demonstrated as well as a small benign appearing left renal cyst. No bladder or ureteral calculi and no hydronephrosis. Unremarkable adrenal glands. Stomach/Bowel: Surgical clips in the region of the gastroesophageal junction. Unremarkable stomach, small bowel, colon and appendix. Vascular/Lymphatic: Atheromatous arterial calcifications without aneurysm. No enlarged lymph nodes. Reproductive: Moderately enlarged prostate gland. Other: Small to moderate-sized left inguinal hernia containing fat. Musculoskeletal: Lumbar and lower thoracic spine degenerative changes. IMPRESSION: 1. Cholelithiasis with a probable small gallstone lodged in the gallbladder neck with associated interval acute cholecystitis. 2. Mild intrahepatic biliary ductal dilatation, most likely due to obstruction by the changes of acute cholecystitis with dilatation of the gallbladder. 3. Stable tiny, nonobstructing right renal calculus. 4. Moderately enlarged prostate gland. 5. Small to moderate-sized left inguinal hernia containing fat. 6.  Calcific coronary artery and aortic atherosclerosis. Aortic Atherosclerosis (ICD10-I70.0). Electronically Signed   By: Beckie Salts M.D.   On: 01/12/2020 15:59      MDM   Clinical Course as of Jan 11 1618  Sat Jan 12, 2020  1517 My initial contact and examination of the patient.  Reports pain at 3/10 following morphine.  Denies nausea currently. Last ate or drank around 9:30 AM this morning.   [SJ]  [id number] Spoke with Dr. Gerrit Friends,  general surgeon. States he will admit the patient. Agrees with treatments initiated thus far.    [SJ]    Clinical Course User Index [SJ] Della Homan, Hillard Danker, PA-C   Took patient care handoff report from [place], [place]. Plan: Patient is at the beginning of his work-up.  Review labs and imaging studies.  Disposition accordingly.  Patient presents with abdominal pain, nausea, vomiting, and loose stool.  He has tenderness in the upper abdomen. I personally reviewed and interpreted the patient's labs and imaging studies. Significant leukocytosis present.  Thankfully, patient's vital signs are reassuring.  Low suspicion for sepsis.  CT with evidence of cholecystitis and cholelithiasis.  Patient admitted via general surgery.  Findings and plan of care discussed with Arby Barrette, MD.    Vitals:   01/12/20 1207 01/12/20 1420 01/12/20 1530 01/12/20 1534  BP: 134/83 (!) 154/90 (!) 152/71   Pulse: 89 80 73 81  Resp: 18 18 18    Temp: 99.3 F (37.4  C)     TempSrc: Oral     SpO2: 94% 93% 99% 96%  Weight:      Height:          Lorayne Bender, PA-C 01/12/20 1708    Charlesetta Shanks, MD 01/12/20 1715

## 2020-01-12 NOTE — ED Triage Notes (Signed)
The patient believes he has been food poisined. He has not been able to eat solid food since Thursday. He presents with emesis and mid abdominal pain.

## 2020-01-12 NOTE — ED Notes (Signed)
Called lab to follow up on status of Lactic result

## 2020-01-12 NOTE — ED Provider Notes (Signed)
[facility]-EMERGENCY DEPT Provider Note   CSN: [id number] Arrival date & time: 01/12/20  1150     History Chief Complaint  Patient presents with  . Emesis  . Abdominal Pain    David Nunez is a 74 y.o. male.  Patient with history of abdominal surgery for a stomach ulcer (unable to tell me if this was due to a perforation), history of hypertension, kidney stones --presents to the emergency department with 2 to 3-day history of abdominal pain, vomiting, and diarrhea. This started after eating chili from [facility]. Patient was initially concerned that he had contracted food poisoning. Patient last had dry heaves this morning and reports an episode of bilious vomiting yesterday. He reports one episode of soft watery stool per day without blood noted. Pain is mainly in the upper abdomen. He uses Aleve sparingly, no routine use. Denies alcohol use. No fevers, chest pain, shortness of breath. No urinary tract infection symptoms. Patient has not eaten solid food in several days. Earlier today he had Jell-O and Gatorade.        Past Medical History:  Diagnosis Date  . Anemia   . BPH (benign prostatic hyperplasia)   . Hypertension     There are no problems to display for this patient.   Past Surgical History:  Procedure Laterality Date  . JOINT REPLACEMENT     let knee replacement       History reviewed. No pertinent family history.  Social History   Tobacco Use  . Smoking status: Never Smoker  . Smokeless tobacco: Never Used  Substance Use Topics  . Alcohol use: No  . Drug use: No    Home Medications Prior to Admission medications   Medication Sig Start Date End Date Taking? Authorizing Provider  amLODipine (NORVASC) 10 MG tablet Take 10 mg by mouth daily.    [provider]  ciprofloxacin (CIPRO) 500 MG tablet Take 500 mg by mouth 2 (two) times daily.    [provider]  cyanocobalamin 500 MCG tablet Take 500 mcg by mouth  daily.    [provider]  ferrous sulfate 325 (65 FE) MG tablet Take 325 mg by mouth daily with breakfast.    [provider]  hydrochlorothiazide (HYDRODIURIL) 25 MG tablet Take 25 mg by mouth daily.    [provider]  meloxicam (MOBIC) 7.5 MG tablet Take 7.5 mg by mouth 2 (two) times daily as needed for pain.    [provider]  omeprazole (PRILOSEC) 20 MG capsule Take 20 mg by mouth daily.    [provider]  terazosin (HYTRIN) 2 MG capsule Take 2 mg by mouth at bedtime.    [provider]    Allergies    Patient has no known allergies.  Review of Systems   Review of Systems  Constitutional: Negative for fever.  HENT: Negative for rhinorrhea and sore throat.   Eyes: Negative for redness.  Respiratory: Negative for cough.   Cardiovascular: Negative for chest pain.  Gastrointestinal: Positive for abdominal pain, diarrhea, nausea and vomiting. Negative for blood in stool and constipation.  Genitourinary: Negative for dysuria.  Musculoskeletal: Negative for myalgias.  Skin: Negative for rash.  Neurological: Negative for headaches.    Physical Exam Updated Vital Signs BP (!) 154/90   Pulse 80   Temp 99.3 F (37.4 C) (Oral)   Resp 18   Ht 5\' 8"  (1.727 m)   Wt 123.8 kg   SpO2 93%   BMI 41.51  kg/m   Physical Exam Vitals and nursing note reviewed.  Constitutional:      Appearance: He is well-developed.  HENT:     Head: Normocephalic and atraumatic.  Eyes:     General:        Right eye: No discharge.        Left eye: No discharge.     Conjunctiva/sclera: Conjunctivae normal.  Cardiovascular:     Rate and Rhythm: Normal rate and regular rhythm.     Heart sounds: Normal heart sounds.  Pulmonary:     Effort: Pulmonary effort is normal.     Breath sounds: Normal breath sounds.  Abdominal:     Palpations: Abdomen is soft.     Tenderness: There is generalized abdominal tenderness and tenderness in the right upper  quadrant, epigastric area, periumbilical area and left upper quadrant. There is no guarding or rebound. Negative signs include Murphy's sign, Rovsing's sign and McBurney's sign.     Comments: Patient with generalized abdominal pain which is mild in the lower abdomen and moderate in the upper abdomen. Patient winces with palpation in the upper abdomen.  Musculoskeletal:     Cervical back: Normal range of motion and neck supple.  Skin:    General: Skin is warm and dry.  Neurological:     Mental Status: He is alert.     ED Results / Procedures / Treatments   Labs (all labs ordered are listed, but only abnormal results are displayed) Labs Reviewed  COMPREHENSIVE METABOLIC PANEL - Abnormal; Notable for the following components:      Result Value   Glucose, Bld 106 (*)    Total Bilirubin 3.0 (*)    All other components within normal limits  CBC - Abnormal; Notable for the following components:   WBC 24.9 (*)    RBC 5.91 (*)    All other components within normal limits  LIPASE, BLOOD  URINALYSIS, ROUTINE W REFLEX MICROSCOPIC  LACTIC ACID, PLASMA  LACTIC ACID, PLASMA    EKG None  Radiology No results found.  Procedures Procedures (including critical care time)  Medications Ordered in ED Medications  sodium chloride flush (NS) 0.9 % injection 3 mL (3 mLs Intravenous Given 01/12/20 1447)  morphine 4 MG/ML injection 4 mg (4 mg Intravenous Given 01/12/20 1506)  ondansetron (ZOFRAN) injection 4 mg (4 mg Intravenous Given 01/12/20 1506)  sodium chloride 0.9 % bolus 1,000 mL (1,000 mLs Intravenous New Bag/Given 01/12/20 1504)    ED Course  I have reviewed the triage vital signs and the nursing notes.  Pertinent labs & imaging results that were available during my care of the patient were reviewed by me and considered in my medical decision making (see chart for details).  Patient seen and examined. Lab work ordered. Will treat pain, nausea and give IV fluids. Given duration of  symptoms as well as significant pain with palpation in the upper abdomen, CT of the abdomen and pelvis ordered to evaluate for gallbladder disease, pancreatitis, bowel perforation, colitis, enteritis.  Vital signs reviewed and are as follows: BP (!) 154/90   Pulse 80   Temp 99.3 F (37.4 C) (Oral)   Resp 18   Ht 5\' 8"  (1.727 m)   Wt 123.8 kg   SpO2 93%   BMI 41.51 kg/m   3:29 PM Sign-out to Joy PA-C at shift change. Pending CT. WBC elevated.   I obtained PIV by .   EMERGENCY DEPARTMENT  US GUIDANCE EXAM Emergency Ultrasound:  US Guidance  for Needle Guidance  INDICATIONS: Difficult vascular access Linear probe used in real-time to visualize location of needle entry through skin.   PERFORMED BY: Myself IMAGES ARCHIVED?: No LIMITATIONS: None VIEWS USED: Transverse INTERPRETATION: Needle visualized within vein, Right arm and Needle gauge 20   Clinical Course as of Jan 12 1527  Sat Jan 12, 2020  1517 Reports pain at 3/10.   [SJ]    Clinical Course User Index [SJ] Joy, Helane Gunther, PA-C   MDM Rules/Calculators/A&P                          Pending work-up.     Final Clinical Impression(s) / ED Diagnoses Final diagnoses:  None    Rx / DC Orders ED Discharge Orders    None       Carlisle Cater, PA-C 01/12/20 [place]    Charlesetta Shanks, MD 01/12/20 1715

## 2020-01-13 LAB — COMPREHENSIVE METABOLIC PANEL
ALT: 17 U/L (ref 0–44)
AST: 15 U/L (ref 15–41)
Albumin: 3.2 g/dL — ABNORMAL LOW (ref 3.5–5.0)
Alkaline Phosphatase: 82 U/L (ref 38–126)
Anion gap: 10 (ref 5–15)
BUN: 14 mg/dL (ref 8–23)
CO2: 22 mmol/L (ref 22–32)
Calcium: 8.8 mg/dL — ABNORMAL LOW (ref 8.9–10.3)
Chloride: 101 mmol/L (ref 98–111)
Creatinine, Ser: 1.27 mg/dL — ABNORMAL HIGH (ref 0.61–1.24)
GFR calc Af Amer: >60 mL/min (ref >60)
GFR calc non Af Amer: 56 mL/min — ABNORMAL LOW (ref >60)
Glucose, Bld: 113 mg/dL — ABNORMAL HIGH (ref 70–99)
Potassium: 4 mmol/L (ref 3.5–5.1)
Sodium: 133 mmol/L — ABNORMAL LOW (ref 135–145)
Total Bilirubin: 3.5 mg/dL — ABNORMAL HIGH (ref 0.3–1.2)
Total Protein: 6.7 g/dL (ref 6.5–8.1)

## 2020-01-13 LAB — CBC
HCT: 42.9 % (ref 39.0–52.0)
Hemoglobin: 14.4 g/dL (ref 13.0–17.0)
MCH: 27.6 pg (ref 26.0–34.0)
MCHC: 33.6 g/dL (ref 30.0–36.0)
MCV: 82.3 fL (ref 80.0–100.0)
Platelets: 173 10*3/uL (ref 150–400)
RBC: 5.21 MIL/uL (ref 4.22–5.81)
RDW: 14.7 % (ref 11.5–15.5)
WBC: 29.8 10*3/uL — ABNORMAL HIGH (ref 4.0–10.5)
nRBC: 0 % (ref 0.0–0.2)

## 2020-01-13 MED ORDER — KCL IN DEXTROSE-NACL 10-5-0.45 MEQ/L-%-% IV SOLN
INTRAVENOUS | Status: DC
  Filled 2020-01-13 (×10): qty 1000

## 2020-01-13 MED ORDER — ENOXAPARIN SODIUM 40 MG/0.4ML ~~LOC~~ SOLN
40.0000 mg | Freq: Once | SUBCUTANEOUS | Status: AC
  Administered 2020-01-13: 40 mg via SUBCUTANEOUS
  Filled 2020-01-13: qty 0.4

## 2020-01-13 NOTE — Progress Notes (Signed)
Subjective/Chief Complaint: Pain is improved.  No n/v.  Pain level 3.     Objective: Vital signs in last 24 hours: Temp:  [98.1 F (36.7 C)-100.9 F (38.3 C)] 100.9 F (38.3 C) (06/27 0821) Pulse Rate:  [73-98] 91 (06/27 0821) Resp:  [16-20] 16 (06/27 0821) BP: (121-159)/(67-90) 141/77 (06/27 0821) SpO2:  [90 %-99 %] 94 % (06/27 0821) Weight:  [123.8 kg] 123.8 kg (06/26 1205) Last BM Date: 01/12/20  Intake/Output from previous day: 06/26 0701 - 06/27 0700 In: 3266.7 [P.O.:1730; I.V.:386.7; IV Piggyback:1150] Out: 645 [Urine:645] Intake/Output this shift: No intake/output data recorded.  General appearance: alert, cooperative and OOB in chair Resp: breathing normally GI: soft, protuberant.  mild RUQ tenderness Extremities: extremities normal, atraumatic, no cyanosis or edema  Lab Results:  Recent Labs    01/12/20 1209 01/13/20 0536  WBC 24.9* 29.8*  HGB 16.1 14.4  HCT 47.7 42.9  PLT 178 173   BMET Recent Labs    01/12/20 1209 01/13/20 0536  NA 135 133*  K 4.0 4.0  CL 99 101  CO2 25 22  GLUCOSE 106* 113*  BUN 10 14  CREATININE 0.99 1.27*  CALCIUM 9.5 8.8*   PT/INR No results for input(s): LABPROT, INR in the last 72 hours. ABG No results for input(s): PHART, HCO3 in the last 72 hours.  Invalid input(s): PCO2, PO2  Studies/Results: CT ABDOMEN PELVIS W CONTRAST  Result Date: 01/12/2020 CLINICAL DATA:  Left upper quadrant abdominal pain and right lower quadrant abdominal pain for the past 3 days with vomiting. Possible food poisoning. EXAM: CT ABDOMEN AND PELVIS WITH CONTRAST TECHNIQUE: Multidetector CT imaging of the abdomen and pelvis was performed using the standard protocol following bolus administration of intravenous contrast. CONTRAST:  OMNIPAQUE IOHEXOL 300 MG/ML  SOLN COMPARISON:  09/26/2015 FINDINGS: Lower chest: Mild bibasilar linear atelectasis/scarring. Borderline enlarged heart. Coronary artery and aortic calcifications.  Hepatobiliary: Small calcified granuloma in the liver. Mild intrahepatic biliary ductal dilatation. Interval dilated gallbladder with diffuse wall thickening and interval pericholecystic edema. Multiple small gallstones in the gallbladder measuring up to 8 mm in maximum diameter each. Probable small gallstone in the gallbladder neck on image number 38 series 2. The common duct is not dilated and no common duct stones are visualized. Pancreas: Mild diffuse pancreatic atrophy. Spleen: Normal in size without focal abnormality. Adrenals/Urinary Tract: Again demonstrated is a tiny mid right renal calculus. A right renal benign appearing cyst is again demonstrated as well as a small benign appearing left renal cyst. No bladder or ureteral calculi and no hydronephrosis. Unremarkable adrenal glands. Stomach/Bowel: Surgical clips in the region of the gastroesophageal junction. Unremarkable stomach, small bowel, colon and appendix. Vascular/Lymphatic: Atheromatous arterial calcifications without aneurysm. No enlarged lymph nodes. Reproductive: Moderately enlarged prostate gland. Other: Small to moderate-sized left inguinal hernia containing fat. Musculoskeletal: Lumbar and lower thoracic spine degenerative changes. IMPRESSION: 1. Cholelithiasis with a probable small gallstone lodged in the gallbladder neck with associated interval acute cholecystitis. 2. Mild intrahepatic biliary ductal dilatation, most likely due to obstruction by the changes of acute cholecystitis with dilatation of the gallbladder. 3. Stable tiny, nonobstructing right renal calculus. 4. Moderately enlarged prostate gland. 5. Small to moderate-sized left inguinal hernia containing fat. 6.  Calcific coronary artery and aortic atherosclerosis. Aortic Atherosclerosis (ICD10-I70.0). Electronically Signed   By: Beckie Salts M.D.   On: 01/12/2020 15:59    Anti-infectives: Anti-infectives (From admission, onward)   Start     Dose/Rate Route Frequency Ordered  Stop  01/12/20 2359  piperacillin-tazobactam (ZOSYN) IVPB 3.375 g     Discontinue     3.375 g 12.5 mL/hr over 240 Minutes Intravenous Every 8 hours 01/12/20 1652     01/12/20 1615  piperacillin-tazobactam (ZOSYN) IVPB 3.375 g        3.375 g 100 mL/hr over 30 Minutes Intravenous  Once 01/12/20 1605 01/12/20 1655      Assessment/Plan: Acute calculous cholecystitis Elevated bilirubin.  plan continued IV antibiotics.  Zosyn d 2. NPO after midnight.  Clears OK until then Plan chole tomorrow.  High risk of open operation due to prior upper abdominal surgery.   One dose lovenox this AM, then resume post op.   Mild HTN Acute kidney injury- increase IV fluids.     LOS: 1 day    Stark Klein 01/13/2020

## 2020-01-13 NOTE — Anesthesia Preprocedure Evaluation (Addendum)
Anesthesia Evaluation  Patient identified by MRN, date of birth, ID band Patient awake    Reviewed: Allergy & Precautions, NPO status , Patient's Chart, lab work & pertinent test results  History of Anesthesia Complications Negative for: history of anesthetic complications  Airway Mallampati: III  TM Distance: >3 FB Neck ROM: Full    Dental  (+) Poor Dentition, Loose,    Pulmonary neg pulmonary ROS,    breath sounds clear to auscultation       Cardiovascular hypertension, Pt. on medications  Rhythm:Regular Rate:Tachycardia     Neuro/Psych negative neurological ROS  negative psych ROS   GI/Hepatic Neg liver ROS, GERD  Medicated and Controlled,Acute cholecystitis, hx of PUD s/p partial gastrectomy   Endo/Other  Morbid obesity  Renal/GU ARFRenal disease (Cr 0.99-->1.27)   BPH    Musculoskeletal negative musculoskeletal ROS (+)   Abdominal   Peds  Hematology negative hematology ROS (+)   Anesthesia Other Findings Day of surgery medications reviewed with patient.  Reproductive/Obstetrics negative OB ROS                            Anesthesia Physical Anesthesia Plan  ASA: III  Anesthesia Plan: General   Post-op Pain Management:    Induction: Intravenous  PONV Risk Score and Plan: 4 or greater and Treatment may vary due to age or medical condition, Ondansetron and Dexamethasone  Airway Management Planned: Oral ETT  Additional Equipment: None  Intra-op Plan:   Post-operative Plan: Extubation in OR  Informed Consent: I have reviewed the patients History and Physical, chart, labs and discussed the procedure including the risks, benefits and alternatives for the proposed anesthesia with the patient or authorized representative who has indicated his/her understanding and acceptance.     Dental advisory given  Plan Discussed with: CRNA  Anesthesia Plan Comments:         Anesthesia Quick Evaluation

## 2020-01-14 ENCOUNTER — Inpatient Hospital Stay (HOSPITAL_COMMUNITY): Payer: No Typology Code available for payment source | Admitting: Anesthesiology

## 2020-01-14 ENCOUNTER — Encounter (HOSPITAL_COMMUNITY): Admission: AD | Disposition: A | Discharge status: Home / Self Care | Payer: Self-pay | Source: Home / Self Care

## 2020-01-14 ENCOUNTER — Inpatient Hospital Stay (HOSPITAL_COMMUNITY): Payer: No Typology Code available for payment source

## 2020-01-14 HISTORY — PX: CHOLECYSTECTOMY: SHX55

## 2020-01-14 LAB — SURGICAL PCR SCREEN
MRSA, PCR: NEGATIVE
Staphylococcus aureus: NEGATIVE

## 2020-01-14 LAB — GLUCOSE, CAPILLARY: Glucose-Capillary: 128 mg/dL — ABNORMAL HIGH (ref 70–99)

## 2020-01-14 SURGERY — LAPAROSCOPIC CHOLECYSTECTOMY WITH INTRAOPERATIVE CHOLANGIOGRAM
Anesthesia: General | Site: Abdomen

## 2020-01-14 MED ORDER — LIDOCAINE 2% (20 MG/ML) 5 ML SYRINGE
INTRAMUSCULAR | Status: DC | PRN
  Administered 2020-01-14: 100 mg via INTRAVENOUS

## 2020-01-14 MED ORDER — ONDANSETRON HCL 4 MG/2ML IJ SOLN
INTRAMUSCULAR | Status: DC | PRN
  Administered 2020-01-14: 4 mg via INTRAVENOUS

## 2020-01-14 MED ORDER — FENTANYL CITRATE (PF) 250 MCG/5ML IJ SOLN
INTRAMUSCULAR | Status: DC | PRN
  Administered 2020-01-14 (×2): 50 ug via INTRAVENOUS
  Administered 2020-01-14: 100 ug via INTRAVENOUS

## 2020-01-14 MED ORDER — TRAMADOL HCL 50 MG PO TABS
50.0000 mg | ORAL_TABLET | Freq: Four times a day (QID) | ORAL | Status: DC | PRN
  Administered 2020-01-14 – 2020-01-16 (×6): 50 mg via ORAL
  Filled 2020-01-14 (×6): qty 1

## 2020-01-14 MED ORDER — PROMETHAZINE HCL 25 MG/ML IJ SOLN: 6.2500 mg | INTRAMUSCULAR | Status: DC | PRN

## 2020-01-14 MED ORDER — DOCUSATE SODIUM 100 MG PO CAPS
100.0000 mg | ORAL_CAPSULE | Freq: Two times a day (BID) | ORAL | Status: DC
  Administered 2020-01-14 – 2020-01-17 (×5): 100 mg via ORAL
  Filled 2020-01-14 (×7): qty 1

## 2020-01-14 MED ORDER — ROCURONIUM BROMIDE 10 MG/ML (PF) SYRINGE
PREFILLED_SYRINGE | INTRAVENOUS | Status: DC | PRN
  Administered 2020-01-14: 10 mg via INTRAVENOUS
  Administered 2020-01-14: 70 mg via INTRAVENOUS

## 2020-01-14 MED ORDER — LACTATED RINGERS IV SOLN: INTRAVENOUS | Status: DC

## 2020-01-14 MED ORDER — 0.9 % SODIUM CHLORIDE (POUR BTL) OPTIME
TOPICAL | Status: DC | PRN
  Administered 2020-01-14: 4000 mL

## 2020-01-14 MED ORDER — VITAMIN D 25 MCG (1000 UNIT) PO TABS
1000.0000 [IU] | ORAL_TABLET | Freq: Every day | ORAL | Status: DC
  Administered 2020-01-15 – 2020-01-17 (×3): 1000 [IU] via ORAL
  Filled 2020-01-14 (×3): qty 1

## 2020-01-14 MED ORDER — FENTANYL CITRATE (PF) 250 MCG/5ML IJ SOLN
INTRAMUSCULAR | Status: AC
  Filled 2020-01-14: qty 5

## 2020-01-14 MED ORDER — ROCURONIUM BROMIDE 10 MG/ML (PF) SYRINGE
PREFILLED_SYRINGE | INTRAVENOUS | Status: AC
  Filled 2020-01-14: qty 10

## 2020-01-14 MED ORDER — AMLODIPINE BESYLATE 10 MG PO TABS
10.0000 mg | ORAL_TABLET | Freq: Every day | ORAL | Status: DC
  Administered 2020-01-14 – 2020-01-21 (×8): 10 mg via ORAL
  Filled 2020-01-14 (×8): qty 1

## 2020-01-14 MED ORDER — BUPIVACAINE-EPINEPHRINE (PF) 0.5% -1:200000 IJ SOLN
INTRAMUSCULAR | Status: AC
  Filled 2020-01-14: qty 30

## 2020-01-14 MED ORDER — PHENYLEPHRINE HCL-NACL 10-0.9 MG/250ML-% IV SOLN
INTRAVENOUS | Status: DC | PRN
  Administered 2020-01-14: 40 ug/min via INTRAVENOUS

## 2020-01-14 MED ORDER — PROPOFOL 10 MG/ML IV BOLUS
INTRAVENOUS | Status: DC | PRN
  Administered 2020-01-14: 200 mg via INTRAVENOUS

## 2020-01-14 MED ORDER — BUPIVACAINE-EPINEPHRINE 0.5% -1:200000 IJ SOLN
INTRAMUSCULAR | Status: DC | PRN
  Administered 2020-01-14: 30 mL

## 2020-01-14 MED ORDER — DEXAMETHASONE SODIUM PHOSPHATE 10 MG/ML IJ SOLN
INTRAMUSCULAR | Status: AC
  Filled 2020-01-14: qty 1

## 2020-01-14 MED ORDER — LIDOCAINE 2% (20 MG/ML) 5 ML SYRINGE
INTRAMUSCULAR | Status: AC
  Filled 2020-01-14: qty 5

## 2020-01-14 MED ORDER — VITAMIN B-12 1000 MCG PO TABS
500.0000 ug | ORAL_TABLET | Freq: Every day | ORAL | Status: DC
  Administered 2020-01-15 – 2020-01-17 (×3): 500 ug via ORAL
  Filled 2020-01-14 (×3): qty 1

## 2020-01-14 MED ORDER — METHOCARBAMOL 500 MG PO TABS
500.0000 mg | ORAL_TABLET | Freq: Four times a day (QID) | ORAL | Status: DC | PRN
  Administered 2020-01-15 – 2020-01-17 (×4): 500 mg via ORAL
  Filled 2020-01-14 (×4): qty 1

## 2020-01-14 MED ORDER — SUGAMMADEX SODIUM 500 MG/5ML IV SOLN
INTRAVENOUS | Status: DC | PRN
Start: 2020-01-14 — End: 2020-01-14
  Administered 2020-01-14: 247.6 mg via INTRAVENOUS

## 2020-01-14 MED ORDER — PHENYLEPHRINE 40 MCG/ML (10ML) SYRINGE FOR IV PUSH (FOR BLOOD PRESSURE SUPPORT)
PREFILLED_SYRINGE | INTRAVENOUS | Status: DC | PRN
  Administered 2020-01-14 (×2): 100 ug via INTRAVENOUS

## 2020-01-14 MED ORDER — CHLORHEXIDINE GLUCONATE 0.12 % MT SOLN
15.0000 mL | Freq: Once | OROMUCOSAL | Status: AC
  Administered 2020-01-14: 15 mL via OROMUCOSAL

## 2020-01-14 MED ORDER — PROPOFOL 10 MG/ML IV BOLUS
INTRAVENOUS | Status: AC
  Filled 2020-01-14: qty 20

## 2020-01-14 MED ORDER — SIMETHICONE 80 MG PO CHEW
80.0000 mg | CHEWABLE_TABLET | Freq: Four times a day (QID) | ORAL | Status: DC | PRN
  Administered 2020-01-16: 80 mg via ORAL
  Filled 2020-01-14: qty 1

## 2020-01-14 MED ORDER — ACETAMINOPHEN 500 MG PO TABS
1000.0000 mg | ORAL_TABLET | Freq: Once | ORAL | Status: AC
  Administered 2020-01-14: 1000 mg via ORAL
  Filled 2020-01-14: qty 2

## 2020-01-14 MED ORDER — FENTANYL CITRATE (PF) 100 MCG/2ML IJ SOLN
25.0000 ug | INTRAMUSCULAR | Status: DC | PRN
  Administered 2020-01-14 (×3): 25 ug via INTRAVENOUS

## 2020-01-14 MED ORDER — ONDANSETRON HCL 4 MG/2ML IJ SOLN
INTRAMUSCULAR | Status: AC
  Filled 2020-01-14: qty 2

## 2020-01-14 MED ORDER — FENTANYL CITRATE (PF) 100 MCG/2ML IJ SOLN
INTRAMUSCULAR | Status: AC
  Administered 2020-01-14: 25 ug via INTRAVENOUS
  Filled 2020-01-14: qty 2

## 2020-01-14 MED ORDER — FERROUS SULFATE 325 (65 FE) MG PO TABS
325.0000 mg | ORAL_TABLET | ORAL | Status: DC
  Administered 2020-01-16: 325 mg via ORAL
  Filled 2020-01-14: qty 1

## 2020-01-14 MED ORDER — LACTATED RINGERS IR SOLN
Status: DC | PRN
  Administered 2020-01-14: 1000 mL

## 2020-01-14 MED ORDER — PHENYLEPHRINE HCL (PRESSORS) 10 MG/ML IV SOLN
INTRAVENOUS | Status: AC
  Filled 2020-01-14: qty 1

## 2020-01-14 MED ORDER — OXYCODONE HCL 5 MG/5ML PO SOLN: 5.0000 mg | Freq: Once | ORAL | Status: DC | PRN

## 2020-01-14 MED ORDER — POLYETHYLENE GLYCOL 3350 17 G PO PACK: 17.0000 g | PACK | Freq: Every day | ORAL | Status: DC | PRN

## 2020-01-14 MED ORDER — DEXAMETHASONE SODIUM PHOSPHATE 10 MG/ML IJ SOLN
INTRAMUSCULAR | Status: DC | PRN
  Administered 2020-01-14: 10 mg via INTRAVENOUS

## 2020-01-14 MED ORDER — SUGAMMADEX SODIUM 500 MG/5ML IV SOLN
INTRAVENOUS | Status: AC
  Filled 2020-01-14: qty 5

## 2020-01-14 MED ORDER — PHENYLEPHRINE 40 MCG/ML (10ML) SYRINGE FOR IV PUSH (FOR BLOOD PRESSURE SUPPORT)
PREFILLED_SYRINGE | INTRAVENOUS | Status: AC
  Filled 2020-01-14: qty 10

## 2020-01-14 MED ORDER — OXYCODONE HCL 5 MG PO TABS: 5.0000 mg | ORAL_TABLET | Freq: Once | ORAL | Status: DC | PRN

## 2020-01-14 MED ORDER — IOHEXOL 300 MG/ML  SOLN
INTRAMUSCULAR | Status: DC | PRN
  Administered 2020-01-14: 15 mL

## 2020-01-14 SURGICAL SUPPLY — 57 items
ADH SKN CLS APL DERMABOND .7 (GAUZE/BANDAGES/DRESSINGS)
APL PRP STRL LF DISP 70% ISPRP (MISCELLANEOUS) ×2
APPLIER CLIP ROT 10 11.4 M/L (STAPLE) ×3
APR CLP MED LRG 11.4X10 (STAPLE) ×1
BAG SPEC RTRVL LRG 6X4 10 (ENDOMECHANICALS) ×1
BLADE EXTENDED COATED 6.5IN (ELECTRODE) ×2 IMPLANT
BOOT SUTURE VASCULAR YLW (MISCELLANEOUS) ×3
CABLE HIGH FREQUENCY MONO STRZ (ELECTRODE) ×3 IMPLANT
CATH REDDICK CHOLANGI 4FR 50CM (CATHETERS) ×2 IMPLANT
CHLORAPREP W/TINT 26 (MISCELLANEOUS) ×6 IMPLANT
CLAMP SUTURE YELLOW 5 PAIRS (MISCELLANEOUS) IMPLANT
CLIP APPLIE ROT 10 11.4 M/L (STAPLE) ×1 IMPLANT
CLOSURE WOUND 1/2 X4 (GAUZE/BANDAGES/DRESSINGS)
COVER MAYO STAND STRL (DRAPES) ×3 IMPLANT
COVER SURGICAL LIGHT HANDLE (MISCELLANEOUS) ×3 IMPLANT
COVER WAND RF STERILE (DRAPES) IMPLANT
DECANTER SPIKE VIAL GLASS SM (MISCELLANEOUS) ×3 IMPLANT
DERMABOND ADVANCED (GAUZE/BANDAGES/DRESSINGS)
DERMABOND ADVANCED .7 DNX12 (GAUZE/BANDAGES/DRESSINGS) IMPLANT
DRAIN CHANNEL 19F RND (DRAIN) ×2 IMPLANT
DRAPE C-ARM 42X120 X-RAY (DRAPES) ×3 IMPLANT
DRSG OPSITE POSTOP 4X10 (GAUZE/BANDAGES/DRESSINGS) ×2 IMPLANT
DRSG TEGADERM 2-3/8X2-3/4 SM (GAUZE/BANDAGES/DRESSINGS) ×2 IMPLANT
DRSG TEGADERM 4X4.75 (GAUZE/BANDAGES/DRESSINGS) ×2 IMPLANT
ELECT REM PT RETURN 15FT ADLT (MISCELLANEOUS) ×3 IMPLANT
EVACUATOR SILICONE 100CC (DRAIN) ×2 IMPLANT
GAUZE SPONGE 2X2 8PLY STRL LF (GAUZE/BANDAGES/DRESSINGS) ×1 IMPLANT
GLOVE SURG ORTHO 8.0 STRL STRW (GLOVE) ×3 IMPLANT
GOWN STRL REUS W/TWL XL LVL3 (GOWN DISPOSABLE) ×6 IMPLANT
HANDLE SUCTION POOLE (INSTRUMENTS) IMPLANT
HEMOSTAT SURGICEL 4X8 (HEMOSTASIS) IMPLANT
KIT BASIN (CUSTOM PROCEDURE TRAY) ×3 IMPLANT
KIT TURNOVER KIT A (KITS) IMPLANT
PENCIL SMOKE EVACUATOR (MISCELLANEOUS) IMPLANT
POUCH SPECIMEN RETRIEVAL 10MM (ENDOMECHANICALS) ×3 IMPLANT
SCISSORS LAP 5X35 DISP (ENDOMECHANICALS) ×3 IMPLANT
SET CHOLANGIOGRAPH MIX (MISCELLANEOUS) ×3 IMPLANT
SET IRRIG TUBING LAPAROSCOPIC (IRRIGATION / IRRIGATOR) ×3 IMPLANT
SET TUBE SMOKE EVAC HIGH FLOW (TUBING) IMPLANT
SLEEVE XCEL OPT CAN 5 100 (ENDOMECHANICALS) ×3 IMPLANT
SPONGE DRAIN TRACH 4X4 STRL 2S (GAUZE/BANDAGES/DRESSINGS) ×2 IMPLANT
SPONGE GAUZE 2X2 STER 10/PKG (GAUZE/BANDAGES/DRESSINGS) ×2
STRIP CLOSURE SKIN 1/2X4 (GAUZE/BANDAGES/DRESSINGS) IMPLANT
SUCTION POOLE HANDLE (INSTRUMENTS) ×3
SUT ETHILON 2 0 PS N (SUTURE) ×2 IMPLANT
SUT MNCRL AB 4-0 PS2 18 (SUTURE) ×3 IMPLANT
SUT NOVA NAB GS-21 1 T12 (SUTURE) ×8 IMPLANT
SUT SILK 2 0 (SUTURE) ×3
SUT SILK 2 0 SH CR/8 (SUTURE) ×2 IMPLANT
SUT SILK 2-0 18XBRD TIE 12 (SUTURE) IMPLANT
TAG SUTURE CLAMP YLW 5PR (MISCELLANEOUS) ×1
TOWEL OR 17X26 10 PK STRL BLUE (TOWEL DISPOSABLE) ×3 IMPLANT
TOWEL OR NON WOVEN STRL DISP B (DISPOSABLE) ×3 IMPLANT
TRAY LAPAROSCOPIC (CUSTOM PROCEDURE TRAY) ×3 IMPLANT
TROCAR BLADELESS OPT 5 100 (ENDOMECHANICALS) ×3 IMPLANT
TROCAR XCEL BLUNT TIP 100MML (ENDOMECHANICALS) ×3 IMPLANT
TROCAR XCEL NON-BLD 11X100MML (ENDOMECHANICALS) ×3 IMPLANT

## 2020-01-14 NOTE — Op Note (Signed)
Operative Note  Pre-operative Diagnosis:  Acute cholecystitis, cholelithiasis  Post-operative Diagnosis:  same  Surgeon:  Armandina Gemma, MD  Assistant:  Barkley Boards, PA-C   Procedure:  Diagnostic laparoscopy, open cholecystectomy with cholangiography  Anesthesia:  general  Estimated Blood Loss:  500cc  Drains: 19Fr Blake drain subhepatic space         Specimen: gallbladder to pathology  Indications:  Patient is a 74 year old male who presents to the emergency department with 3-day history of abdominal pain, nausea, and emesis.  Patient has had chills.  Patient indicates pain in the upper and mid abdomen.  He states he has not had any episodes of pain similar to this.  He denies any history of hepatobiliary or pancreatic disease.  He denies any history of jaundice or acholic stools.  Previous abdominal surgery for partial gastrectomy for peptic ulcer disease approximately 25 years ago.  In the emergency department the patient is noted to have an elevated white blood cell count of 24.9.  Total bilirubin elevated at 3.0, LFT's otherwise normal. Patient underwent CT scan of the abdomen pelvis with findings consistent with acute cholecystitis and cholelithiasis.  General surgery is called for evaluation and management.  Procedure:  The patient was seen in the pre-op holding area. The risks, benefits, complications, treatment options, and expected outcomes were previously discussed with the patient. The patient agreed with the proposed plan and has signed the informed consent form.  The patient was brought to the operating room by the surgical team, identified as David Nunez and the procedure verified. A "time out" was completed and the above information confirmed.  Following induction of general anesthesia, the patient is positioned and then prepped and draped in the usual aseptic fashion.  After ascertaining that an adequate Nunez of anesthesia been achieved, an infraumbilical incision is made  with #15 blade.  Dissection was carried down to the fascia.  Fascia is incised in the midline and the peritoneal cavity is entered cautiously.  A 0 Vicryl pursestring suture is placed in the fascia.  An Hassan cannula is introduced under direct vision and secured with the pursestring suture.  Abdomen is insufflated with carbon dioxide.  Laparoscope was introduced and the abdomen is explored.  There are numerous dense adhesions of the small bowel, mesentery, and omentum to the patient's complex midline surgical incision.  With some difficulty the right upper quadrant is visualized.  There is a large inflammatory mass extending between the hepatic flexure of the colon and the liver.  The gallbladder is actually not visible.  Decision is made at this point to convert to open surgery.  Right subcostal incision is made with a #10 blade.  Dissection is carried down through subcutaneous tissues and hemostasis achieved with the electrocautery.  Abdominal wall was divided in layers with the electrocautery and the peritoneal cavity is entered cautiously.  A Bookwalter retractors placed for exposure.  A markedly inflamed, edematous, and partially necrotic gallbladder is identified.  This is gently dissected out using blunt dissection and judicious use of the electrocautery.  Hemostasis is obtained along the liver edge with 2-0 silk suture ligatures.  Gallbladder is gently dissected out of the gallbladder fossa.  Vascular structures are divided between ligaclips with the electrocautery.  Dissection is carried down to the neck of the gallbladder.  Cystic artery is identified, occluded with a Ligaclip, and divided with the electrocautery.  Neck of the gallbladder is then dissected out down to the cystic duct.  Tissues are quite friable  and inflamed.  Action the neck of the gallbladder is perforated and a small amount of of cloudy bile and stones are spilled into the peritoneal cavity.  A clamp was placed across the neck of  the gallbladder and the gallbladder is sharply excised and passed off the field.  Stones are retrieved from the peritoneal cavity.  Fluid is evacuated.  Attempt is made at cholangiography by introducing a Reddick catheter into the end of the cystic duct.  With irrigation additional stones are retrieved from the cystic duct.  Using a 3-0 Prolene suture in a running suture is placed at the end of the cystic duct.  This is pulled taut in order to keep the Reddick catheter in position.  All instruments and sponges are removed in order to perform cholangiography.  Injection of contrast into the biliary system under C arm fluoroscopy reveals a mildly dilated but otherwise normal-appearing biliary system with contrast refluxing into the right and left hepatic ductal systems and free flow distally into the duodenum.  There did not appear to be any retained stones.  Reddick catheter is removed from the cystic duct and removed from the field.  3-0 Prolene suture is tied securely.  Right upper quadrant is irrigated with warm saline and good hemostasis is achieved throughout the operative field.  All sponges are removed.  A 19 [place] Blake drain is brought in from a right upper quadrant stab wound and placed in the subhepatic space near the stump of the cystic duct.  It is secured to the skin of a 2-0 nylon suture.  Drain is placed to bulb suction.  Abdominal wall is closed in layers with running #1 Novafil suture.  Subcutaneous tissues are irrigated.  Skin is closed with stainless steel staples at both incisions.  Wounds are washed and dried and sterile dressings are applied.  Patient is awakened from anesthesia and brought to the recovery room.  The patient tolerated the procedure well.   David Level, MD [facility] Office: [phone]

## 2020-01-14 NOTE — Anesthesia Postprocedure Evaluation (Signed)
Anesthesia Post Note  Patient: David Nunez  Procedure(s) Performed: DIAGNOSTIC LAPAROSCOPY WITH OPEN CHOLECYSTECTOMY AND INTRAOPERATIVE CHOLANGIOGRAM (N/A Abdomen)     Patient location during evaluation: PACU Anesthesia Type: General Level of consciousness: awake and alert and oriented Pain management: pain level controlled Vital Signs Assessment: post-procedure vital signs reviewed and stable Respiratory status: spontaneous breathing, nonlabored ventilation and respiratory function stable Cardiovascular status: blood pressure returned to baseline Postop Assessment: no apparent nausea or vomiting Anesthetic complications: no   No complications documented.  Last Vitals:  Vitals:   01/14/20 1100 01/14/20 1132  BP: (!) 160/81 (!) 152/87  Pulse: 86 82  Resp: (!) 27 14  Temp:  36.4 C  SpO2: 92% 95%    Last Pain:  Vitals:   01/14/20 1132  TempSrc: Oral  PainSc:                  Kaylyn Layer

## 2020-01-14 NOTE — Anesthesia Procedure Notes (Signed)
Procedure Name: Intubation Date/Time: 01/14/2020 8:04 AM Performed by: Anne Fu, CRNA Pre-anesthesia Checklist: Patient identified, Emergency Drugs available, Suction available, Patient being monitored and Timeout performed Patient Re-evaluated:Patient Re-evaluated prior to induction Oxygen Delivery Method: Circle system utilized Preoxygenation: Pre-oxygenation with 100% oxygen Induction Type: IV induction Ventilation: Mask ventilation without difficulty Laryngoscope Size: Mac and 4 Grade View: Grade I Tube type: Oral Tube size: 7.5 mm Number of attempts: 1 Airway Equipment and Method: Stylet Placement Confirmation: ETT inserted through vocal cords under direct vision,  positive ETCO2 and breath sounds checked- equal and bilateral Secured at: 22 cm Tube secured with: Tape Dental Injury: Teeth and Oropharynx as per pre-operative assessment  Comments: Teeth as per pre-op, no noted damage or trauma noted post intubation.

## 2020-01-14 NOTE — Transfer of Care (Signed)
Immediate Anesthesia Transfer of Care Note  Patient: David Nunez  Procedure(s) Performed: Procedure(s): DIAGNOSTIC LAPAROSCOPY WITH OPEN CHOLECYSTECTOMY AND INTRAOPERATIVE CHOLANGIOGRAM (N/A)  Patient Location: PACU  Anesthesia Type:General  Level of Consciousness:  sedated, patient cooperative and responds to stimulation  Airway & Oxygen Therapy:Patient Spontanous Breathing and Patient connected to face mask oxgen  Post-op Assessment:  Report given to PACU RN and Post -op Vital signs reviewed and stable  Post vital signs:  Reviewed and stable  Last Vitals:  Vitals:   01/14/20 0639 01/14/20 0728  BP: (!) 160/74 (!) 145/91  Pulse: (!) 110 (!) 104  Resp: 18 20  Temp: 37.2 C 37.3 C  SpO2: 92% 92%    Complications: No apparent anesthesia complications

## 2020-01-15 ENCOUNTER — Inpatient Hospital Stay (HOSPITAL_COMMUNITY): Payer: No Typology Code available for payment source

## 2020-01-15 ENCOUNTER — Encounter (HOSPITAL_COMMUNITY): Payer: Self-pay | Admitting: Surgery

## 2020-01-15 DIAGNOSIS — H43399 Other vitreous opacities, unspecified eye: Secondary | ICD-10-CM | POA: Diagnosis not present

## 2020-01-15 DIAGNOSIS — M7989 Other specified soft tissue disorders: Secondary | ICD-10-CM

## 2020-01-15 DIAGNOSIS — H53139 Sudden visual loss, unspecified eye: Secondary | ICD-10-CM | POA: Diagnosis not present

## 2020-01-15 DIAGNOSIS — H53133 Sudden visual loss, bilateral: Secondary | ICD-10-CM

## 2020-01-15 DIAGNOSIS — Z8673 Personal history of transient ischemic attack (TIA), and cerebral infarction without residual deficits: Secondary | ICD-10-CM

## 2020-01-15 DIAGNOSIS — I10 Essential (primary) hypertension: Secondary | ICD-10-CM

## 2020-01-15 DIAGNOSIS — H53129 Transient visual loss, unspecified eye: Secondary | ICD-10-CM

## 2020-01-15 DIAGNOSIS — T148XXA Other injury of unspecified body region, initial encounter: Secondary | ICD-10-CM | POA: Diagnosis not present

## 2020-01-15 LAB — COMPREHENSIVE METABOLIC PANEL
ALT: 23 U/L (ref 0–44)
AST: 23 U/L (ref 15–41)
Albumin: 2.6 g/dL — ABNORMAL LOW (ref 3.5–5.0)
Alkaline Phosphatase: 88 U/L (ref 38–126)
Anion gap: 8 (ref 5–15)
BUN: 19 mg/dL (ref 8–23)
CO2: 25 mmol/L (ref 22–32)
Calcium: 8.8 mg/dL — ABNORMAL LOW (ref 8.9–10.3)
Chloride: 99 mmol/L (ref 98–111)
Creatinine, Ser: 1.07 mg/dL (ref 0.61–1.24)
GFR calc Af Amer: >60 mL/min (ref >60)
GFR calc non Af Amer: >60 mL/min (ref >60)
Glucose, Bld: 137 mg/dL — ABNORMAL HIGH (ref 70–99)
Potassium: 4.4 mmol/L (ref 3.5–5.1)
Sodium: 132 mmol/L — ABNORMAL LOW (ref 135–145)
Total Bilirubin: 2.1 mg/dL — ABNORMAL HIGH (ref 0.3–1.2)
Total Protein: 6.6 g/dL (ref 6.5–8.1)

## 2020-01-15 LAB — CBC
HCT: 34.9 % — ABNORMAL LOW (ref 39.0–52.0)
Hemoglobin: 11.5 g/dL — ABNORMAL LOW (ref 13.0–17.0)
MCH: 26.8 pg (ref 26.0–34.0)
MCHC: 33 g/dL (ref 30.0–36.0)
MCV: 81.4 fL (ref 80.0–100.0)
Platelets: 180 10*3/uL (ref 150–400)
RBC: 4.29 MIL/uL (ref 4.22–5.81)
RDW: 14.4 % (ref 11.5–15.5)
WBC: 23.1 10*3/uL — ABNORMAL HIGH (ref 4.0–10.5)
nRBC: 0 % (ref 0.0–0.2)

## 2020-01-15 LAB — ECHOCARDIOGRAM COMPLETE
Height: 68 in
Weight: 4368 oz

## 2020-01-15 LAB — SURGICAL PATHOLOGY

## 2020-01-15 MED ORDER — ASPIRIN EC 81 MG PO TBEC
81.0000 mg | DELAYED_RELEASE_TABLET | Freq: Every day | ORAL | Status: DC
  Administered 2020-01-15 – 2020-01-17 (×3): 81 mg via ORAL
  Filled 2020-01-15 (×3): qty 1

## 2020-01-15 MED ORDER — PERFLUTREN LIPID MICROSPHERE
1.0000 mL | INTRAVENOUS | Status: AC | PRN
  Administered 2020-01-15: 3 mL via INTRAVENOUS
  Filled 2020-01-15: qty 10

## 2020-01-15 MED ORDER — SODIUM CHLORIDE (PF) 0.9 % IJ SOLN
INTRAMUSCULAR | Status: AC
  Filled 2020-01-15: qty 50

## 2020-01-15 MED ORDER — IOHEXOL 350 MG/ML SOLN
100.0000 mL | Freq: Once | INTRAVENOUS | Status: AC | PRN
  Administered 2020-01-15: 100 mL via INTRAVENOUS

## 2020-01-15 NOTE — Care Management Important Message (Signed)
Important Message  Patient Details IM Letter given to [name] SW Case Manager to present to the Patient Name: David Nunez MRN: [id number] Date of Birth: [birth year]   Medicare Important Message Given:  Yes     Caren Macadam 01/15/2020, 10:29 AM

## 2020-01-15 NOTE — Progress Notes (Signed)
PT Cancellation Note  Patient Details Name: David Nunez MRN: [id number] DOB: May 15, 1946   Cancelled Treatment:    Reason Eval/Treat Not Completed: Medical issues which prohibited therapy. Will hold PT for now until CT imaging has been completed. Will check back tomorrow.    Faye Ramsay, PT Acute Rehabilitation  Office: [phone] Pager: [phone]

## 2020-01-15 NOTE — Progress Notes (Signed)
Carotid artery duplex and left upper extremity venous duplex has been completed. Preliminary results can be found in CV Proc through chart review.   01/15/20 10:48 AM Olen Cordial RVT

## 2020-01-15 NOTE — Progress Notes (Signed)
Patient reported that he is having nightmares and is having difficulty sleeping since admitted to the hospital.

## 2020-01-15 NOTE — Progress Notes (Addendum)
1 Day Post-Op    CC:  Subjective: Nursing worried about response, arms tingling but has bilateral IV infiltrates.  He is also complaining of seeing spots which are still present.  It sounds like he gets most of his care from the [place] in [place].  He did not really remember having a stroke initially, nor issues with a possible aneurysm.  He is unsure if he has had any follow-up for this.  I attempted to call his wife but call went to voicemail.  He is alert and oriented.  He knows why he is here and what happened yesterday.  He has blistering in both arms from IV sites and the new IV is being placed now.  His abdominal incision is clean and dry.  Port site are fine.  The drain has a reddish-brown fluid in it.  I cannot tell whether it is just blood orbits combination of blood and bile.  Objective: Vital signs in last 24 hours: Temp:  [97.6 F (36.4 C)-98.5 F (36.9 C)] 97.9 F (36.6 C) (06/29 0520) Pulse Rate:  [79-86] 83 (06/29 0520) Resp:  [14-35] 18 (06/29 0520) BP: (135-163)/(70-95) 135/72 (06/29 0520) SpO2:  [90 %-98 %] 94 % (06/29 0520) Last BM Date: 01/12/20 390 p.o. 1850 IV 451 urine Drain 451 Stool 0 Afebrile vital signs are stable Sodium 132, glucose 137, calcium 8.8, albumin 2.6, total bilirubin 2.1 Total bilirubin 3.0>>3.5>>2.1 WBC 24.9>> 29.8>> 23.1 Intraoperative cholangiogram:Mobile filling defects within the left intrahepatic ducts may represent air bubbles introduced during the intraoperative cholangiogram versus small sludge balls, or less likely choledocholithiasis. No evidence of stones in the common bile duct. 2. Variant biliary ductal anatomy noted incidentally CT scan 01/12/2020: Cholelithiasis with probable small gallstone lodged in the gallbladder neck associated with interval cholecystitis, mild intrahepatic biliary dilatation most likely due to obstruction by changes of acute cholecystitis, stable nonobstructing right renal calculus, moderately enlarged  prostate small to moderate sized left inguinal hernia containing fat. Intake/Output from previous day: 06/28 0701 - 06/29 0700 In: 2243.1 [P.O.:390; I.V.:1829.2; IV Piggyback:23.9] Out: 1446 [Urine:451; Drains:445; Blood:550] Intake/Output this shift: Total I/O In: -  Out: 300 [Urine:200; Drains:100]  General appearance: alert, cooperative and no distress Resp: clear to auscultation bilaterally Cardio: Regular rate and rhythm GI: Soft, sore, port site and incision are clean and dry.  Drain with a fair amount of reddish-brown drainage. Extremities: Trace edema lower extremities.  He has blisters of both forearms at the site of prior IV fluids. He is alert and oriented.  He is moving all extremities well.  He continues to see spots..  Lab Results:  Recent Labs    01/13/20 0536 01/15/20 0505  WBC 29.8* 23.1*  HGB 14.4 11.5*  HCT 42.9 34.9*  PLT 173 180    BMET Recent Labs    01/13/20 0536 01/15/20 0505  NA 133* 132*  K 4.0 4.4  CL 101 99  CO2 22 25  GLUCOSE 113* 137*  BUN 14 19  CREATININE 1.27* 1.07  CALCIUM 8.8* 8.8*   PT/INR No results for input(s): LABPROT, INR in the last 72 hours.  Recent Labs  Lab 01/12/20 1209 01/13/20 0536 01/15/20 0505  AST 17 15 23   ALT 16 17 23   ALKPHOS 95 82 88  BILITOT 3.0* 3.5* 2.1*  PROT 7.8 6.7 6.6  ALBUMIN 4.0 3.2* 2.6*     Lipase     Component Value Date/Time   LIPASE 17 01/12/2020 1209   . dextrose 5 % and 0.45 %  NaCl with KCl 10 mEq/L 100 mL/hr at 01/14/20 0200  . piperacillin-tazobactam (ZOSYN)  IV 3.375 g (01/15/20 0016)   Prior to Admission medications   Medication Sig Start Date End Date Taking? Authorizing Provider  amLODipine (NORVASC) 10 MG tablet Take 10 mg by mouth daily.   Yes [provider]  cholecalciferol (VITAMIN D3) 25 MCG (1000 UNIT) tablet Take 1,000 Units by mouth daily.   Yes [provider]  cyanocobalamin 500 MCG tablet Take 500 mcg by mouth daily.   Yes [provider]  ferrous sulfate 325 (65 FE) MG tablet Take 325 mg by mouth as directed. MWF   Yes [provider]  naproxen sodium (ALEVE) 220 MG tablet Take 220 mg by mouth daily as needed (pain).   Yes [provider]  omeprazole (PRILOSEC) 20 MG capsule Take 20 mg by mouth daily.   Yes [provider]  traMADol (ULTRAM) 50 MG tablet Take 50 mg by mouth every 6 (six) hours as needed for moderate pain.   Yes [provider]  ciprofloxacin (CIPRO) 500 MG tablet Take 500 mg by mouth 2 (two) times daily. Patient not taking: Reported on 01/12/2020    [provider]      Medications: . amLODipine  10 mg Oral Daily  . cholecalciferol  1,000 Units Oral Daily  . docusate sodium  100 mg Oral BID  . [START ON 01/16/2020] ferrous sulfate  325 mg Oral Q M,W,F  . pantoprazole (PROTONIX) IV  40 mg Intravenous QHS  . 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 Darnell Level  FEN: IV fluids/clear liquids ID: Laqueta Jean 6/26 >> day 4 DVT: SCDs Follow-up: Dr. Gerrit Friends  Plan: I get a leave him on clear liquids for now.  Continue IV antibiotics.  He has a new IV site working.  I have attempted to contact his wife to see if there is been any more follow-up on his aneurysm.  I will also ask Medicine to see.  Repeat labs in AM.    LOS: 3 days    Shamirah Ivan 01/15/2020 Please see Amion

## 2020-01-15 NOTE — Progress Notes (Signed)
When assessing bilateral upper extremities for PIV placement noted blisters to left forearm from PIV infiltrate and bilsters to right forearm from PIV infiltrate on 6/28 per patient. Will continue to monitor.

## 2020-01-15 NOTE — Plan of Care (Signed)

## 2020-01-15 NOTE — Progress Notes (Signed)
Echocardiogram 2D Echocardiogram has been performed.  David Nunez 01/15/2020, 12:29 PM

## 2020-01-15 NOTE — Consult Note (Addendum)
Medical Consultation   David Nunez  QJJ:[id number]  DOB: 1946/04/15  DOA: 01/12/2020  PCP: [facility]    Requesting physician: Towana Badger, PA  Reason for consultation: Reported changes in vision transiently, increased weakness and left hand, blisters/skin changes in left arm  History of Present Illness: David Nunez is an 74 y.o. male HTN, PUD, history of total knee replacement history of CVA (2009, 2012)who presented on 01/12/2020 with abdominal pain and emesis and found to have acute calculous cholecystitis limited bilirubin.  He underwent open cholecystectomy on 01/14/2020. 06/29 patient complained of vision changes with spotting. Patient reports he seems to be doing well from abdominal standpoint after his cholecystectomy.  States earlier this morning noticed a black curtain crossing both feel the vision horizontally that lasted approximately 1 second.  Since then has had alternating white and black spots in his vision.  He denies any difficulty speaking or other new weakness.  He also noted increased swelling in his left arm at site of previous IV.  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  No source of cardiac embolus was identified at that time.  Prior to that in 2009 he also found to have several small acute nonhemorrhagic infarcts in left frontal/parietal lobe.  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  Review of systems are otherwise negative.    Past Medical History: Past Medical History:  Diagnosis Date  . Anemia   . BPH (benign prostatic hyperplasia)   . Hypertension     Past Surgical History: Past Surgical History:  Procedure Laterality  Date  . CHOLECYSTECTOMY N/A 01/14/2020   Procedure: DIAGNOSTIC LAPAROSCOPY WITH OPEN CHOLECYSTECTOMY AND INTRAOPERATIVE CHOLANGIOGRAM;  Surgeon: Darnell Level, MD;  Location: [facility];  Service: General;  Laterality: N/A;  . JOINT REPLACEMENT     let knee replacement     Allergies:  No Known Allergies   Social History:  reports that he has never smoked. He has never used smokeless tobacco. He reports that he does not drink alcohol and does not use drugs.   Family History: History reviewed. No pertinent family history.    Physical Exam: Vitals:   01/14/20 2211 01/15/20 0200 01/15/20 0520 01/15/20 0938  BP: (!) 162/81 137/84 135/72 135/85  Pulse: 81 84 83 83  Resp: (!) 21 18 18 14   Temp: 97.8 F (36.6 C) 97.6 F (36.4 C) 97.9 F (36.6 C) 97.9 F (36.6 C)  TempSrc: Oral Oral Oral Oral  SpO2: 95% 90% 94% 93%  Weight:      Height:       Elderly male, lying in bed, no distress Extraocular motion intact, Normal oral mucosa Regular rate and rhythm, no appreciable rubs murmurs or gallops Normal respiratory effort, scant crackles in the left lower base, Abdomen distended, soft, appropriately tender after recent surgery, surgical dressing in place Alert oriented x4, able to move all extremities against resistance.  But reporting alternating white/black spots in both fields of vision Normal affect, normal mood Blisters on left arm near antecubital fossa with significant fullness and swelling Blisters also on right arm     Data reviewed:  I have personally reviewed following labs and imaging studies Labs:  CBC: Recent Labs  Lab 01/12/20 1209 01/13/20 0536 01/15/20 0505  WBC 24.9* 29.8* 23.1*  HGB 16.1 14.4 11.5*  HCT 47.7 42.9 34.9*  MCV 80.7 82.3 81.4  PLT 178 173 180    Basic Metabolic Panel: Recent Labs  Lab 01/12/20 1209 01/12/20 1209 01/13/20 0536 01/15/20 0505  NA 135  --  133* 132*  K 4.0   < > 4.0 4.4  CL 99  --  101 99  CO2 25  --  22 25  GLUCOSE  106*  --  113* 137*  BUN 10  --  14 19  CREATININE 0.99  --  1.27* 1.07  CALCIUM 9.5  --  8.8* 8.8*   < > = values in this interval not displayed.   GFR Estimated Creatinine Clearance: 80 mL/min (by C-G formula based on SCr of 1.07 mg/dL). Liver Function Tests: Recent Labs  Lab 01/12/20 1209 01/13/20 0536 01/15/20 0505  AST 17 15 23   ALT 16 17 23   ALKPHOS 95 82 88  BILITOT 3.0* 3.5* 2.1*  PROT 7.8 6.7 6.6  ALBUMIN 4.0 3.2* 2.6*   Recent Labs  Lab 01/12/20 1209  LIPASE 17   No results for input(s): AMMONIA in the last 168 hours. Coagulation profile No results for input(s): INR, PROTIME in the last 168 hours.  Cardiac Enzymes: No results for input(s): CKTOTAL, CKMB, CKMBINDEX, TROPONINI in the last 168 hours. BNP: Invalid input(s): POCBNP CBG: Recent Labs  Lab 01/14/20 1008  GLUCAP 128*   D-Dimer No results for input(s): DDIMER in the last 72 hours. Hgb A1c No results for input(s): HGBA1C in the last 72 hours. Lipid Profile No results for input(s): CHOL, HDL, LDLCALC, TRIG, CHOLHDL, LDLDIRECT in the last 72 hours. Thyroid function studies No results for input(s): TSH, T4TOTAL, T3FREE, THYROIDAB in the last 72 hours.  Invalid input(s): FREET3 Anemia work up No results for input(s): VITAMINB12, FOLATE, FERRITIN, TIBC, IRON, RETICCTPCT in the last 72 hours. Urinalysis    Component Value Date/Time   COLORURINE YELLOW 01/12/2020 1618   APPEARANCEUR CLEAR 01/12/2020 1618   LABSPEC 1.028 01/12/2020 1618   PHURINE 5.0 01/12/2020 1618   GLUCOSEU NEGATIVE 01/12/2020 1618   HGBUR SMALL (A) 01/12/2020 1618   BILIRUBINUR NEGATIVE 01/12/2020 1618   KETONESUR NEGATIVE 01/12/2020 1618   PROTEINUR 30 (A) 01/12/2020 1618   NITRITE NEGATIVE 01/12/2020 1618   LEUKOCYTESUR SMALL (A) 01/12/2020 1618     Microbiology Recent Results (from the past 240 hour(s))  SARS Coronavirus 2 by RT PCR (hospital order, performed in [facility] hospital lab) Nasopharyngeal  Nasopharyngeal Swab     Status: None   Collection Time: 01/12/20  4:20 PM   Specimen: Nasopharyngeal Swab  Result Value Ref Range Status   SARS Coronavirus 2 NEGATIVE NEGATIVE Final    Comment: (NOTE) SARS-CoV-2 target nucleic acids are NOT DETECTED.  The SARS-CoV-2 RNA is generally detectable in upper and lower respiratory specimens during the acute phase of infection. The lowest concentration of SARS-CoV-2 viral copies this assay can detect is 250 copies / mL. A negative result does not preclude SARS-CoV-2 infection and should not be used as the sole basis for treatment or other patient management decisions.  A negative result may occur with improper specimen collection / handling, submission of specimen other than nasopharyngeal swab, presence of viral mutation(s) within the areas targeted by this assay, and inadequate number of viral copies (<250 copies / mL). A  negative result must be combined with clinical observations, patient history, and epidemiological information.  Fact Sheet for Patients:   BoilerBrush.com.cy  Fact Sheet for Healthcare Providers: https://pope.com/  This test is not yet approved or  cleared by the [place] FDA and has been authorized for detection and/or diagnosis of SARS-CoV-2 by FDA under an Emergency Use Authorization (EUA).  This EUA will remain in effect (meaning this test can be used) for the duration of the COVID-19 declaration under Section 564(b)(1) of the Act, 21 U.S.C. section 360bbb-3(b)(1), unless the authorization is terminated or revoked sooner.  Performed at [facility], [address], [place], [place] [address]   Surgical pcr screen     Status: None   Collection Time: 01/14/20 12:08 AM   Specimen: Nasal Mucosa; Nasal Swab  Result Value Ref Range Status   MRSA, PCR NEGATIVE NEGATIVE Final   Staphylococcus aureus NEGATIVE NEGATIVE Final    Comment: (NOTE) The  Xpert SA Assay (FDA approved for NASAL specimens in patients 56 years of age and older), is one component of a comprehensive surveillance program. It is not intended to diagnose infection nor to guide or monitor treatment. Performed at [facility], [address], [place], [place] [address]        Inpatient Medications:   Scheduled Meds: . amLODipine  10 mg Oral Daily  . cholecalciferol  1,000 Units Oral Daily  . docusate sodium  100 mg Oral BID  . [START ON 01/16/2020] ferrous sulfate  325 mg Oral Q M,W,F  . pantoprazole (PROTONIX) IV  40 mg Intravenous QHS  . vitamin B-12  500 mcg Oral Daily   Continuous Infusions: . dextrose 5 % and 0.45 % NaCl with KCl 10 mEq/L 100 mL/hr at 01/14/20 0200  . piperacillin-tazobactam (ZOSYN)  IV 3.375 g (01/15/20 0928)     Radiological Exams on Admission: DG Cholangiogram Operative  Result Date: 01/14/2020 CLINICAL DATA:  74 year old male undergoing laparoscopic cholecystectomy with intraoperative cholangiogram. EXAM: INTRAOPERATIVE CHOLANGIOGRAM TECHNIQUE: Cholangiographic images from the C-arm fluoroscopic device were submitted for interpretation post-operatively. Please see the procedural report for the amount of contrast and the fluoroscopy time utilized. COMPARISON:  CT scan of the abdomen and pelvis 01/12/2020 FINDINGS: Several saved cine clips are submitted for review. The images demonstrate cannulation of the cystic duct remanent and opacification of the biliary tree. Atypical biliary ductal anatomy. A portion of the left biliary tree appears to drain directly into the common bile duct adjacent to the cystic duct origin. There is mild intra and extrahepatic biliary ductal dilatation but no evidence of biliary stenosis, stricture or distal choledocholithiasis. There are a few mobile filling defects within the left hepatic ducts. IMPRESSION: 1. Mobile filling defects within the left intrahepatic ducts may represent air bubbles  introduced during the intraoperative cholangiogram versus small sludge balls, or less likely choledocholithiasis. No evidence of stones in the common bile duct. 2. Variant biliary ductal anatomy noted incidentally. Electronically Signed   By: Malachy Moan M.D.   On: 01/14/2020 10:01    Impression/Recommendations Principal Problem:   Cholelithiasis with acute cholecystitis Active Problems:   History of peptic ulcer disease   Hypertension   Cholecystitis, acute with cholelithiasis   Sudden vision loss with white/black spots and reported horizontal black curtain Differential includes amaurosis fugax, CVA.  States the black curtain was transient in nature, but given patient's prior history of CVA and history of ophthalmic aneurysm highly concerning for potential embolic event.  Otherwise is neurologically  intact with no slurred speech or neurologic deficits -Carotid ultrasound -CT head stat --Would recommend monitoring on telemetry(rule out afib), TTE --Discussed with Dr. Otelia LimesLindzen ( on- call neurologist) advises CTA head/neck, aspirin 81 mg and will need outpatient follow up. [facility] unless an actual acute stroke has occurred  Left arm swelling and blisters Likely result of IV infiltration as patient states this acutely changed over the past day (and has similar episode blistering on right arm after IV infiltrated).  Significant amount of swelling in left upper arm with some increased heaviness but no weakness, no tingling,no signs of compartment syndrome.  Suspect is likely all fluid -Obtain DVT ultrasound to ensure no clot disease -Elevate left upper extremity  HTN, currently at goal Has range 135/72-163/86 -Continue home amlodipine 10 mg   Cholecystitis status post recent open cholecystectomy -Per primary  Thank you for this consultation.  [facility] team will follow the patient with you.     Laverna PeaceShayla D Nakyra Bourn M.D. [facility] [email or web address] Password [email or web address]  01/15/2020, 9:59 AM

## 2020-01-16 ENCOUNTER — Inpatient Hospital Stay (HOSPITAL_COMMUNITY): Payer: No Typology Code available for payment source

## 2020-01-16 ENCOUNTER — Encounter (HOSPITAL_COMMUNITY): Payer: Self-pay | Admitting: Internal Medicine

## 2020-01-16 DIAGNOSIS — H43399 Other vitreous opacities, unspecified eye: Secondary | ICD-10-CM

## 2020-01-16 LAB — COMPREHENSIVE METABOLIC PANEL
ALT: 21 U/L (ref 0–44)
AST: 17 U/L (ref 15–41)
Albumin: 2.6 g/dL — ABNORMAL LOW (ref 3.5–5.0)
Alkaline Phosphatase: 90 U/L (ref 38–126)
Anion gap: 5 (ref 5–15)
BUN: 19 mg/dL (ref 8–23)
CO2: 28 mmol/L (ref 22–32)
Calcium: 9 mg/dL (ref 8.9–10.3)
Chloride: 102 mmol/L (ref 98–111)
Creatinine, Ser: 0.9 mg/dL (ref 0.61–1.24)
GFR calc Af Amer: >60 mL/min (ref >60)
GFR calc non Af Amer: >60 mL/min (ref >60)
Glucose, Bld: 145 mg/dL — ABNORMAL HIGH (ref 70–99)
Potassium: 4 mmol/L (ref 3.5–5.1)
Sodium: 135 mmol/L (ref 135–145)
Total Bilirubin: 1.8 mg/dL — ABNORMAL HIGH (ref 0.3–1.2)
Total Protein: 6.1 g/dL — ABNORMAL LOW (ref 6.5–8.1)

## 2020-01-16 LAB — CBC
HCT: 37.5 % — ABNORMAL LOW (ref 39.0–52.0)
Hemoglobin: 12.7 g/dL — ABNORMAL LOW (ref 13.0–17.0)
MCH: 26.8 pg (ref 26.0–34.0)
MCHC: 33.9 g/dL (ref 30.0–36.0)
MCV: 79.3 fL — ABNORMAL LOW (ref 80.0–100.0)
Platelets: 233 10*3/uL (ref 150–400)
RBC: 4.73 MIL/uL (ref 4.22–5.81)
RDW: 14.5 % (ref 11.5–15.5)
WBC: 24.8 10*3/uL — ABNORMAL HIGH (ref 4.0–10.5)
nRBC: 0.1 % (ref 0.0–0.2)

## 2020-01-16 MED ORDER — IOHEXOL 350 MG/ML SOLN
100.0000 mL | Freq: Once | INTRAVENOUS | Status: AC | PRN
  Administered 2020-01-16: 100 mL via INTRAVENOUS

## 2020-01-16 MED ORDER — ENOXAPARIN SODIUM 40 MG/0.4ML ~~LOC~~ SOLN
40.0000 mg | SUBCUTANEOUS | Status: DC
  Administered 2020-01-16 – 2020-01-20 (×5): 40 mg via SUBCUTANEOUS
  Filled 2020-01-16 (×5): qty 0.4

## 2020-01-16 MED ORDER — SODIUM CHLORIDE (PF) 0.9 % IJ SOLN
INTRAMUSCULAR | Status: AC
  Filled 2020-01-16: qty 50

## 2020-01-16 NOTE — Evaluation (Signed)
Physical Therapy Evaluation Patient Details Name: David Nunez MRN: [id number] DOB: [birth year] Today's Date: 01/16/2020   History of Present Illness  Pt is 74 y.o. male HTN, PUD, history TKA, CVA (2009, 2012)who presented on 01/12/2020 with abdominal pain and emesis and found to have acute calculous cholecystitis limited bilirubin.  He underwent open cholecystectomy on 01/14/2020. On 06/29 patient complained of vision changes with spotting.  Pt s/p bil carotid doppler (neg for significant stenosis), UE U/S (no evidence DVT or thrombosis in subclavian), CTA neck (results negative), and CTA Head (previously known periophthalmic aneurysm, Stenosis of L MCA present)  Clinical Impression  Pt admitted with above diagnosis. Pt was able to transfer and ambulate with min cues and min guard.  He did present with mild DOE that he reports is not baseline.  Pt expected to progress well and will benefit from PT to continue to advance mobility. Pt currently with functional limitations due to the deficits listed below (see PT Problem List). Pt will benefit from skilled PT to increase their independence and safety with mobility to allow discharge to the venue listed below.   Additionally, recommend frequent ambulation with nursing staff - pt and wife educated on frequent ambulation with assist (up to bathroom, 2-3 walks in hall/day).     Follow Up Recommendations No PT follow up;Supervision - Intermittent    Equipment Recommendations  None recommended by PT    Recommendations for Other Services       Precautions / Restrictions Precautions Precautions: None      Mobility  Bed Mobility               General bed mobility comments: in chair at arrival  Transfers Overall transfer level: Needs assistance Equipment used: None Transfers: Sit to/from Stand Sit to Stand: Min guard            Ambulation/Gait Ambulation/Gait assistance: Min guard Gait Distance (Feet): 100 Feet   Gait  Pattern/deviations: Decreased stride length Gait velocity: decreased   General Gait Details: fatigued easily and with DOE 3/4; O2 sats 96% and HR 98 bpm  Stairs            Wheelchair Mobility    Modified Rankin (Stroke Patients Only)       Balance Overall balance assessment: Needs assistance Sitting-balance support: No upper extremity supported Sitting balance-Leahy Scale: Normal     Standing balance support: No upper extremity supported Standing balance-Leahy Scale: Good                               Pertinent Vitals/Pain Pain Assessment: 0-10 Pain Score: 5  Pain Location: stomach Pain Descriptors / Indicators: Discomfort Pain Intervention(s): Limited activity within patient's tolerance;Monitored during session    Home Living Family/patient expects to be discharged to:: Private residence Living Arrangements: Spouse/significant other Available Help at Discharge: Family;Available PRN/intermittently Type of Home: House Home Access: Level entry     Home Layout: One level Home Equipment: Emergency planning/management officer - 2 wheels      Prior Function Level of Independence: Independent         Comments: Able to do all ADLs, IADLs, driving, and community ambulation     Hand Dominance        Extremity/Trunk Assessment   Upper Extremity Assessment Upper Extremity Assessment:  (ROM WFL: MMT 5/5; coordination WNL)    Lower Extremity Assessment Lower Extremity Assessment:  (ROM WFL: MMT 5/5; coordination WNL)  Cervical / Trunk Assessment Cervical / Trunk Assessment: Normal  Communication   Communication: No difficulties  Cognition Arousal/Alertness: Awake/alert Behavior During Therapy: WFL for tasks assessed/performed Overall Cognitive Status: Within Functional Limits for tasks assessed                                        General Comments General comments (skin integrity, edema, etc.): EOEM and visual fields intact - denied  vision changes at this time.    Exercises     Assessment/Plan    PT Assessment Patient needs continued PT services  PT Problem List Decreased mobility;Decreased activity tolerance;Cardiopulmonary status limiting activity;Decreased balance;Decreased knowledge of use of DME       PT Treatment Interventions DME instruction;Therapeutic activities;Gait training;Therapeutic exercise;Patient/family education;Stair training;Balance training;Functional mobility training    PT Goals (Current goals can be found in the Care Plan section)  Acute Rehab PT Goals Patient Stated Goal: return home PT Goal Formulation: With patient/family Time For Goal Achievement: 01/30/20 Potential to Achieve Goals: Good    Frequency Min 3X/week   Barriers to discharge        Co-evaluation               AM-PAC PT "6 Clicks" Mobility  Outcome Measure Help needed turning from your back to your side while in a flat bed without using bedrails?: A Little Help needed moving from lying on your back to sitting on the side of a flat bed without using bedrails?: A Little Help needed moving to and from a bed to a chair (including a wheelchair)?: None Help needed standing up from a chair using your arms (e.g., wheelchair or bedside chair)?: None Help needed to walk in hospital room?: None Help needed climbing 3-5 steps with a railing? : A Little 6 Click Score: 21    End of Session   Activity Tolerance: Patient tolerated treatment well Patient left: in chair;with call bell/phone within reach;with family/visitor present Nurse Communication: Mobility status PT Visit Diagnosis: Other abnormalities of gait and mobility (R26.89)    Time: 6734-1937 PT Time Calculation (min) (ACUTE ONLY): 25 min   Charges:   PT Evaluation $PT Eval Low Complexity: 1 Low          Kamisha Ell, PT Acute Rehab Services Pager [phone] [facility] [phone]    Rayetta Humphrey 01/16/2020, 1:32 PM

## 2020-01-16 NOTE — Progress Notes (Signed)
[facility] Progress Note  2 Days Post-Op  Subjective: Patient sitting up in chair this AM. Reports some abdominal pain but states pain medication helps. Denies nausea, passing flatus. Denies bloating.   Objective: Vital signs in last 24 hours: Temp:  [97.1 F (36.2 C)-97.6 F (36.4 C)] 97.1 F (36.2 C) (06/30 0531) Pulse Rate:  [60-88] 88 (06/30 0531) Resp:  [23-24] 23 (06/30 0531) BP: (147-174)/(71-92) 174/84 (06/30 0531) SpO2:  [92 %-97 %] 93 % (06/30 0531) Last BM Date: 01/12/20  Intake/Output from previous day: 06/29 0701 - 06/30 0700 In: 1614.4 [P.O.:480; I.V.:884.3; IV Piggyback:250] Out: 1350 [Urine:1000; Drains:350] Intake/Output this shift: No intake/output data recorded.  PE: General: pleasant, WD, obese male who is laying in bed in NAD HEENT:  Sclera are anicteric.  PERRL.  Ears and nose without any masses or lesions.  Mouth is pink and moist Heart: regular, rate, and rhythm. Palpable radial and pedal pulses bilaterally Lungs: CTAB, no wheezes, rhonchi, or rales noted.  Respiratory effort nonlabored Abd: soft, appropriately ttp, distended, umbilical incision c/d/i, RUQ incision with honeycomb present, drain in RUQ with bilious appearing drainage MS: all 4 extremities are symmetrical with no cyanosis, clubbing, or edema. Skin: warm and dry with no masses, lesions, or rashes Neuro: Cranial nerves 2-12 grossly intact, sensation grossly intact throughout  Psych: A&Ox3 with an appropriate affect.   Lab Results:  Recent Labs    01/15/20 0505 01/16/20 0446  WBC 23.1* 24.8*  HGB 11.5* 12.7*  HCT 34.9* 37.5*  PLT 180 233   BMET Recent Labs    01/15/20 0505 01/16/20 0446  NA 132* 135  K 4.4 4.0  CL 99 102  CO2 25 28  GLUCOSE 137* 145*  BUN 19 19  CREATININE 1.07 0.90  CALCIUM 8.8* 9.0   PT/INR No results for input(s): LABPROT, INR in the last 72 hours. CMP     Component Value Date/Time   NA 135 01/16/2020 0446   K 4.0 01/16/2020 0446    CL 102 01/16/2020 0446   CO2 28 01/16/2020 0446   GLUCOSE 145 (H) 01/16/2020 0446   BUN 19 01/16/2020 0446   CREATININE 0.90 01/16/2020 0446   CALCIUM 9.0 01/16/2020 0446   PROT 6.1 (L) 01/16/2020 0446   ALBUMIN 2.6 (L) 01/16/2020 0446   AST 17 01/16/2020 0446   ALT 21 01/16/2020 0446   ALKPHOS 90 01/16/2020 0446   BILITOT 1.8 (H) 01/16/2020 0446   GFRNONAA >60 01/16/2020 0446   GFRAA >60 01/16/2020 0446   Lipase     Component Value Date/Time   LIPASE 17 01/12/2020 1209       Studies/Results: CT ANGIO HEAD W OR WO CONTRAST  Result Date: 01/15/2020 CLINICAL DATA:  Abnormal vision, history of aneurysm on MRA 2009 EXAM: CT ANGIOGRAPHY HEAD TECHNIQUE: Multidetector CT imaging of the head was performed using the standard protocol during bolus administration of intravenous contrast. Multiplanar CT image reconstructions and MIPs were obtained to evaluate the vascular anatomy. CONTRAST:  OMNIPAQUE IOHEXOL 350 MG/ML SOLN COMPARISON:  Correlation made with MRA from 2009 FINDINGS: CT HEAD Brain: There is no acute intracranial hemorrhage, mass effect, or edema. Gray-white differentiation remains preserved. Ventricles and sulci are within normal limits in size and configuration. There is no extra-axial fluid collection. Vascular: No hyperdense vessel. Skull: Unremarkable. Sinuses: Minor paranasal sinus mucosal thickening. Orbits: Unremarkable. CTA HEAD Anterior circulation: Intracranial internal carotid arteries patent. 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 Exact relationship of the  ophthalmic artery origin to the aneurysm is unclear on this study. Comparison with the prior MRA is limited but there is likely no substantial change. Anterior cerebral arteries are patent. Anterior communicating artery is present. Right middle cerebral artery is patent. There is moderate stenosis at the left MCA origin with decreased caliber of the M1 segment. There is  high-grade stenosis of the left posterior MCA division origin with partial reconstitution, noting diminished caliber of distal left MCA branches as compared to the right. This stenosis has progressed since the 2009 MRA. Posterior circulation: Intracranial vertebral arteries are patent. Right vertebral artery is dominant with minimal calcified plaque. Basilar artery is patent. Posterior cerebral arteries are patent. A left posterior communicating artery is present. Venous sinuses: Not well evaluated. IMPRESSION: Approximately 2 mm left periophthalmic aneurysm. Comparison is difficult but likely no substantial change since 2009 MRA. 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 No acute intracranial abnormality. Electronically Signed   By: Guadlupe Spanish M.D.   On: 01/15/2020 16:05   ECHOCARDIOGRAM COMPLETE  Result Date: 01/15/2020    ECHOCARDIOGRAM REPORT   Patient Name:   DOV DILL Date of Exam: 01/15/2020 Medical Rec #:  [id number]      Height:       68.0 in Accession #:    [id number]     Weight:       273.0 lb Date of Birth:  1945-11-01      BSA:          2.333 m Patient Age:    74 years       BP:           147/80 mmHg Patient Gender: M              HR:           82 bpm. Exam Location:  Inpatient Procedure: 2D Echo, Color Doppler, Cardiac Doppler and Intracardiac            Opacification Agent Indications:    Vision Loss  History:        Patient has no prior history of Echocardiogram examinations.                 Risk Factors:Hypertension.  Sonographer:    Irving Burton Senior RDCS Referring Phys: [id number] Drema Pry D NETTEY  Sonographer Comments: Technically difficult study due to patient body habitus. IMPRESSIONS  1. Left ventricular ejection fraction, by estimation, is 60 to 65%. The left ventricle has normal function. The left ventricle has no regional wall  motion abnormalities. There is mild concentric left ventricular hypertrophy. Left ventricular diastolic parameters are indeterminate.  2. Right ventricular systolic function is normal. The right ventricular size is normal.  3. The mitral valve is normal in structure. No evidence of mitral valve regurgitation. No evidence of mitral stenosis.  4. The aortic valve is normal in structure. Aortic valve regurgitation is not visualized. No aortic stenosis is present.  5. The inferior vena cava is normal in size with greater than 50% respiratory variability, suggesting right atrial pressure of 3 mmHg. FINDINGS  Left Ventricle: Left ventricular ejection fraction, by estimation, is 60 to 65%. The left ventricle has normal function. The left ventricle has no regional wall motion abnormalities. Definity contrast agent was given IV to delineate the left ventricular  endocardial borders. The left ventricular internal cavity size was normal  in size. There is mild concentric left ventricular hypertrophy. Left ventricular diastolic parameters are indeterminate. Right Ventricle: The right ventricular size is normal. No increase in right ventricular wall thickness. Right ventricular systolic function is normal. Left Atrium: Left atrial size was normal in size. Right Atrium: Right atrial size was normal in size. Pericardium: There is no evidence of pericardial effusion. Mitral Valve: The mitral valve is normal in structure. Normal mobility of the mitral valve leaflets. No evidence of mitral valve regurgitation. No evidence of mitral valve stenosis. Tricuspid Valve: The tricuspid valve is normal in structure. Tricuspid valve regurgitation is not demonstrated. No evidence of tricuspid stenosis. Aortic Valve: The aortic valve is normal in structure. Aortic valve regurgitation is not visualized. No aortic stenosis is present. Pulmonic Valve: The pulmonic valve was normal in structure. Pulmonic valve regurgitation is not visualized. No  evidence of pulmonic stenosis. Aorta: The aortic root is normal in size and structure. Venous: The inferior vena cava is normal in size with greater than 50% respiratory variability, suggesting right atrial pressure of 3 mmHg. IAS/Shunts: No atrial level shunt detected by color flow Doppler.  LEFT VENTRICLE PLAX 2D LVIDd:         4.10 cm LVIDs:         2.70 cm LV PW:         1.20 cm LV IVS:        1.20 cm LVOT diam:     2.10 cm LV SV:         55 LV SV Index:   24 LVOT Area:     3.46 cm  RIGHT VENTRICLE RV S prime:     13.90 cm/s TAPSE (M-mode): 2.6 cm LEFT ATRIUM             Index LA diam:        3.40 cm 1.46 cm/m LA Vol (A2C):   44.6 ml 19.12 ml/m LA Vol (A4C):   53.9 ml 23.11 ml/m LA Biplane Vol: 51.2 ml 21.95 ml/m  AORTIC VALVE LVOT Vmax:   77.75 cm/s LVOT Vmean:  59.000 cm/s LVOT VTI:    0.160 m  AORTA Ao Root diam: 3.70 cm Ao Asc diam:  3.30 cm  SHUNTS Systemic VTI:  0.16 m Systemic Diam: 2.10 cm Rachelle Hora Croitoru MD Electronically signed by Thurmon Fair MD Signature Date/Time: 01/15/2020/12:44:28 PM    Final    VAS US CAROTID  Result Date: 01/15/2020 Carotid Arterial Duplex Study Indications:  Transient vision loss. Risk Factors: Hypertension. Limitations   Today's exam was limited due to the body habitus of the patient               and the patient's respiratory variation. Performing Technologist: Chanda Busing RVT  Examination Guidelines: A complete evaluation includes B-mode imaging, spectral Doppler, color Doppler, and power Doppler as needed of all accessible portions of each vessel. Bilateral testing is considered an integral part of a complete examination. Limited examinations for reoccurring indications may be performed as noted.  Right Carotid Findings: +----------+--------+--------+--------+-----------------------+--------+           PSV cm/sEDV cm/sStenosisPlaque Description     Comments +----------+--------+--------+--------+-----------------------+--------+ CCA Prox  101     12               smooth and heterogenous         +----------+--------+--------+--------+-----------------------+--------+ CCA Distal35      9                                               +----------+--------+--------+--------+-----------------------+--------+  ICA Prox  29      14                                              +----------+--------+--------+--------+-----------------------+--------+ ICA Distal65      25                                     tortuous +----------+--------+--------+--------+-----------------------+--------+ ECA       54      8                                               +----------+--------+--------+--------+-----------------------+--------+ +----------+--------+-------+--------+-------------------+           PSV cm/sEDV cmsDescribeArm Pressure (mmHG) +----------+--------+-------+--------+-------------------+ [id number]                                        +----------+--------+-------+--------+-------------------+ +---------+--------+--+--------+--+---------+ VertebralPSV cm/s38EDV cm/s12Antegrade +---------+--------+--+--------+--+---------+  Left Carotid Findings: +----------+--------+--------+--------+-----------------------+--------+           PSV cm/sEDV cm/sStenosisPlaque Description     Comments +----------+--------+--------+--------+-----------------------+--------+ CCA Prox  93      18              smooth and heterogenous         +----------+--------+--------+--------+-----------------------+--------+ CCA Distal79      18              smooth and heterogenous         +----------+--------+--------+--------+-----------------------+--------+ ICA Prox  40      13              smooth and heterogenous         +----------+--------+--------+--------+-----------------------+--------+ ICA Distal47      14                                     tortuous  +----------+--------+--------+--------+-----------------------+--------+ ECA       79      7                                               +----------+--------+--------+--------+-----------------------+--------+ +----------+--------+--------+--------+-------------------+           PSV cm/sEDV cm/sDescribeArm Pressure (mmHG) +----------+--------+--------+--------+-------------------+ Subclavian226                                         +----------+--------+--------+--------+-------------------+ +---------+--------+--+--------+-+---------+ VertebralPSV cm/s30EDV cm/s7Antegrade +---------+--------+--+--------+-+---------+   Summary: Right Carotid: Velocities in the right ICA are consistent with a 1-39% stenosis. Left Carotid: Velocities in the left ICA are consistent with a 1-39% stenosis. Vertebrals: Bilateral vertebral arteries demonstrate antegrade flow. *See table(s) above for measurements and observations.  Electronically signed by Fabienne Bruns MD on 01/15/2020 at 11:31:44 AM.    Final    VAS [place] UPPER EXTREMITY VENOUS DUPLEX  Result Date: 01/15/2020 UPPER VENOUS  STUDY  Indications: Swelling Risk Factors: None identified. Limitations: Body habitus and poor ultrasound/tissue interface. Comparison Study: No prior studies. Performing Technologist: Chanda Busing RVT  Examination Guidelines: A complete evaluation includes B-mode imaging, spectral Doppler, color Doppler, and power Doppler as needed of all accessible portions of each vessel. Bilateral testing is considered an integral part of a complete examination. Limited examinations for reoccurring indications may be performed as noted.  Right Findings: +----------+------------+---------+-----------+----------+-------+ RIGHT     CompressiblePhasicitySpontaneousPropertiesSummary +----------+------------+---------+-----------+----------+-------+ Subclavian    Full       Yes       Yes                       +----------+------------+---------+-----------+----------+-------+  Left Findings: +----------+------------+---------+-----------+----------+-------+ LEFT      CompressiblePhasicitySpontaneousPropertiesSummary +----------+------------+---------+-----------+----------+-------+ IJV           Full       Yes       Yes                      +----------+------------+---------+-----------+----------+-------+ Subclavian    Full       Yes       Yes                      +----------+------------+---------+-----------+----------+-------+ Axillary      Full       Yes       Yes                      +----------+------------+---------+-----------+----------+-------+ Brachial      Full       Yes       Yes                      +----------+------------+---------+-----------+----------+-------+ Radial        Full                                          +----------+------------+---------+-----------+----------+-------+ Ulnar         Full                                          +----------+------------+---------+-----------+----------+-------+ Cephalic      Full                                          +----------+------------+---------+-----------+----------+-------+ Basilic       Full                                          +----------+------------+---------+-----------+----------+-------+  Summary:  Right: No evidence of thrombosis in the subclavian.  Left: No evidence of deep vein thrombosis in the upper extremity. No evidence of superficial vein thrombosis in the upper extremity.  *See table(s) above for measurements and observations.  Diagnosing physician: Fabienne Bruns MD Electronically signed by Fabienne Bruns MD on 01/15/2020 at 11:31:33 AM.    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 Darnell Levelodd Gerkin - POD#2 - WBC slightly elevated at 24.8, afebrile, continue to monitor - continue IV abx - Tbili trending down, drain appears bilious - continue to monitor, if it remains bilious will get a HIDA to r/o bile leak - pain reasonably well controlled, work on mobilizing - advance to FLD  FEN: IV fluids, FLD ID: Zosyn 6/26 >> DVT: SCDs, ok to start lovenox Follow-up: Dr. Gerrit FriendsGerkin  LOS: 4 days    Juliet RudeKelly R Shiya Fogelman , [facility] 01/16/2020, 10:30 AM Please see Amion for pager number during day hours 7:00am-4:30pm

## 2020-01-16 NOTE — Progress Notes (Signed)
PROGRESS NOTE    David Nunez  YHC:[id number] DOB: [birth year] DOA: 01/12/2020 PCP: [facility]   Brief Narrative:  David Nunez is an 74 y.o. male HTN, PUD, history of total knee replacement history of CVA (2009, 2012)who presented on 01/12/2020 with abdominal pain and emesis and found to have acute calculous cholecystitis limited bilirubin.  He underwent open cholecystectomy on 01/14/2020. Hospitalist consulted on 06/29 for sudden and acute of vision changes with spotting. Patient reports he seems to be doing well from abdominal standpoint after his cholecystectomy.  States earlier this morning noticed a black curtain crossing both feel the vision horizontally that lasted approximately 1 second.  Since then has had alternating white and black spots in his vision.  He denies any difficulty speaking or other new weakness.  He also noted increased swelling in his left arm at site of previous IV.  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  No source of cardiac embolus was identified at that time.  Prior to that in 2009 he also found to have several small acute nonhemorrhagic infarcts in left frontal/parietal lobe.  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  Assessment & Plan:   Principal Problem:   Cholelithiasis with acute cholecystitis Active Problems:   History of peptic ulcer disease   Hypertension   Cholecystitis, acute with cholelithiasis   Vision, loss, sudden   Spots in front of the eye   History of CVA (cerebrovascular accident)   Left arm swelling   Blistering   Sudden vision loss/changes, cannot rule out TIA -Patient not indicating his vision was just blurry yesterday and denies any overt vision loss; but does report seeing "spots" -his vision is now back to normal, returning yesterday late in the day and is now back to baseline;  no other notable deficits on exam - Carotid ultrasound bilateral with less than 40% narrowing - CT head unremarkable for acute findings - Previously discussed with Dr. Otelia Limes ( on- call neurologist) advises CTA head/neck, aspirin 81 mg and will need outpatient follow up. No need to transfer to [facility] unless an actual acute stroke has occurred  Left arm swelling and blisters -Left upper extremity DVT study negative  -Likely secondary to infiltration  -Elevate left upper extremity  HTN, essential - Transiently elevated overnight up to 174/84  - Continue home amlodipine 10 mg  Cholecystitis status post recent open cholecystectomy - Per primary   DVT prophylaxis: Per primary Code Status: Full Family Communication: None present  Status is: Inpatient  Dispo: The patient is from: Home              Anticipated d/c is to: Per primary              Anticipated d/c date is: Per primary              Patient currently not medically stable for discharge due to ongoing need for treatment and evaluation as above  Consultants:   We are  Procedures:   Diagnostic laparoscopy, open cholecystectomy with cholangiography and drain placement  Antimicrobials:  Per primary, Zosyn  Subjective: Issues or events overnight, patient has returned back to baseline, able to eat read the menu without difficulties, otherwise remarks ongoing abdominal pain but markedly improved from previous.  Denies headache, fever, chills, shortness of breath, nausea, vomiting, diarrhea, constipation.  Objective: Vitals:  01/15/20 1238 01/15/20 1256 01/15/20 2050 01/16/20 0531  BP: (!) 148/92  (!) 164/71 (!) 174/84  Pulse: 80  60 88  Resp: (!) 24  (!) 23 (!) 23  Temp: 97.6 F (36.4 C)   (!) 97.1 F (36.2 C)  TempSrc: Oral     SpO2: 97% 95% 92% 93%  Weight:      Height:        Intake/Output Summary (Last 24 hours) at 01/16/2020 0744 Last data filed at 01/16/2020 0600 Gross per 24 hour  Intake 1614.37 ml   Output 1050 ml  Net 564.37 ml   Filed Weights   01/12/20 1205  Weight: 123.8 kg    Examination:  General:  Pleasantly resting in bed, No acute distress. HEENT:  Normocephalic atraumatic.  Sclerae nonicteric, noninjected.  Extraocular movements intact bilaterally. Neck:  Without mass or deformity.  Trachea is midline. Lungs:  Clear to auscultate bilaterally without rhonchi, wheeze, or rales. Heart:  Regular rate and rhythm.  Without murmurs, rubs, or gallops. Abdomen:  Soft, postop bandages clean dry intact, right upper quadrant drain intact draining bilious fluid. Extremities: Without cyanosis, clubbing, or obvious deformity. Vascular:  Dorsalis pedis and posterior tibial pulses palpable bilaterally. Skin:  Warm and dry, no erythema, no ulcerations.  Data Reviewed: I have personally reviewed following labs and imaging studies  CBC: Recent Labs  Lab 01/12/20 1209 01/13/20 0536 01/15/20 0505 01/16/20 0446  WBC 24.9* 29.8* 23.1* 24.8*  HGB 16.1 14.4 11.5* 12.7*  HCT 47.7 42.9 34.9* 37.5*  MCV 80.7 82.3 81.4 79.3*  PLT 178 173 180 233   Basic Metabolic Panel: Recent Labs  Lab 01/12/20 1209 01/13/20 0536 01/15/20 0505 01/16/20 0446  NA 135 133* 132* 135  K 4.0 4.0 4.4 4.0  CL 99 101 99 102  CO2 25 22 25 28   GLUCOSE 106* 113* 137* 145*  BUN 10 14 19 19   CREATININE 0.99 1.27* 1.07 0.90  CALCIUM 9.5 8.8* 8.8* 9.0   GFR: Estimated Creatinine Clearance: 95.1 mL/min (by C-G formula based on SCr of 0.9 mg/dL). Liver Function Tests: Recent Labs  Lab 01/12/20 1209 01/13/20 0536 01/15/20 0505 01/16/20 0446  AST 17 15 23 17   ALT 16 17 23 21   ALKPHOS 95 82 88 90  BILITOT 3.0* 3.5* 2.1* 1.8*  PROT 7.8 6.7 6.6 6.1*  ALBUMIN 4.0 3.2* 2.6* 2.6*   Recent Labs  Lab 01/12/20 1209  LIPASE 17   No results for input(s): AMMONIA in the last 168 hours. Coagulation Profile: No results for input(s): INR, PROTIME in the last 168 hours. Cardiac Enzymes: No results for  input(s): CKTOTAL, CKMB, CKMBINDEX, TROPONINI in the last 168 hours. BNP (last 3 results) No results for input(s): PROBNP in the last 8760 hours. HbA1C: No results for input(s): HGBA1C in the last 72 hours. CBG: Recent Labs  Lab 01/14/20 1008  GLUCAP 128*   Lipid Profile: No results for input(s): CHOL, HDL, LDLCALC, TRIG, CHOLHDL, LDLDIRECT in the last 72 hours. Thyroid Function Tests: No results for input(s): TSH, T4TOTAL, FREET4, T3FREE, THYROIDAB in the last 72 hours. Anemia Panel: No results for input(s): VITAMINB12, FOLATE, FERRITIN, TIBC, IRON, RETICCTPCT in the last 72 hours. Sepsis Labs: Recent Labs  Lab 01/12/20 1505 01/12/20 1745  LATICACIDVEN 1.7 1.3    Recent Results (from the past 240 hour(s))  SARS Coronavirus 2 by RT PCR (hospital order, performed in [facility] hospital lab) Nasopharyngeal Nasopharyngeal Swab     Status: None   Collection Time: 01/12/20  4:20 PM   Specimen: Nasopharyngeal Swab  Result Value Ref Range Status   SARS Coronavirus 2 NEGATIVE NEGATIVE Final    Comment: (NOTE) SARS-CoV-2 target nucleic acids are NOT DETECTED.  The SARS-CoV-2 RNA is generally detectable in upper and lower respiratory specimens during the acute phase of infection. The lowest concentration of SARS-CoV-2 viral copies this assay can detect is 250 copies / mL. A negative result does not preclude SARS-CoV-2 infection and should not be used as the sole basis for treatment or other patient management decisions.  A negative result may occur with improper specimen collection / handling, submission of specimen other than nasopharyngeal swab, presence of viral mutation(s) within the areas targeted by this assay, and inadequate number of viral copies (<250 copies / mL). A negative result must be combined with clinical observations, patient history, and epidemiological information.  Fact Sheet for Patients:   BoilerBrush.com.cy  Fact Sheet for  Healthcare Providers: https://pope.com/  This test is not yet approved or  cleared by the [place] FDA and has been authorized for detection and/or diagnosis of SARS-CoV-2 by FDA under an Emergency Use Authorization (EUA).  This EUA will remain in effect (meaning this test can be used) for the duration of the COVID-19 declaration under Section 564(b)(1) of the Act, 21 U.S.C. section 360bbb-3(b)(1), unless the authorization is terminated or revoked sooner.  Performed at [facility], [address], [place], [place] [address]   Surgical pcr screen     Status: None   Collection Time: 01/14/20 12:08 AM   Specimen: Nasal Mucosa; Nasal Swab  Result Value Ref Range Status   MRSA, PCR NEGATIVE NEGATIVE Final   Staphylococcus aureus NEGATIVE NEGATIVE Final    Comment: (NOTE) The Xpert SA Assay (FDA approved for NASAL specimens in patients 33 years of age and older), is one component of a comprehensive surveillance program. It is not intended to diagnose infection nor to guide or monitor treatment. Performed at [facility], [address] [place], [place] [address]          Radiology Studies: CT ANGIO HEAD W OR WO CONTRAST  Result Date: 01/15/2020 CLINICAL DATA:  Abnormal vision, history of aneurysm on MRA 2009 EXAM: CT ANGIOGRAPHY HEAD TECHNIQUE: Multidetector CT imaging of the head was performed using the standard protocol during bolus administration of intravenous contrast. Multiplanar CT image reconstructions and MIPs were obtained to evaluate the vascular anatomy. CONTRAST:  OMNIPAQUE IOHEXOL 350 MG/ML SOLN COMPARISON:  Correlation made with MRA from 2009 FINDINGS: CT HEAD Brain: There is no acute intracranial hemorrhage, mass effect, or edema. Gray-white differentiation remains preserved. Ventricles and sulci are within normal limits in size and configuration. There is no extra-axial fluid collection.  Vascular: No hyperdense vessel. Skull: Unremarkable. Sinuses: Minor paranasal sinus mucosal thickening. Orbits: Unremarkable. CTA HEAD Anterior circulation: Intracranial internal carotid arteries patent. 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 Exact relationship of the ophthalmic artery origin to the aneurysm is unclear on this study. Comparison with the prior MRA is limited but there is likely no substantial change. Anterior cerebral arteries are patent. Anterior communicating artery is present. Right middle cerebral artery is patent. There is moderate stenosis at the left MCA origin with decreased caliber of the M1 segment. There is high-grade stenosis of the left posterior MCA division origin with partial reconstitution, noting diminished caliber of distal left MCA branches as compared to the right. This stenosis has progressed since the 2009 MRA. Posterior  circulation: Intracranial vertebral arteries are patent. Right vertebral artery is dominant with minimal calcified plaque. Basilar artery is patent. Posterior cerebral arteries are patent. A left posterior communicating artery is present. Venous sinuses: Not well evaluated. IMPRESSION: Approximately 2 mm left periophthalmic aneurysm. Comparison is difficult but likely no substantial change since 2009 MRA. 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 No acute intracranial abnormality. Electronically Signed   By: Guadlupe Spanish M.D.   On: 01/15/2020 16:05   DG Cholangiogram Operative  Result Date: 01/14/2020 CLINICAL DATA:  74 year old male undergoing laparoscopic cholecystectomy with intraoperative cholangiogram. EXAM: INTRAOPERATIVE CHOLANGIOGRAM TECHNIQUE: Cholangiographic images from the C-arm fluoroscopic device were submitted for interpretation  post-operatively. Please see the procedural report for the amount of contrast and the fluoroscopy time utilized. COMPARISON:  CT scan of the abdomen and pelvis 01/12/2020 FINDINGS: Several saved cine clips are submitted for review. The images demonstrate cannulation of the cystic duct remanent and opacification of the biliary tree. Atypical biliary ductal anatomy. A portion of the left biliary tree appears to drain directly into the common bile duct adjacent to the cystic duct origin. There is mild intra and extrahepatic biliary ductal dilatation but no evidence of biliary stenosis, stricture or distal choledocholithiasis. There are a few mobile filling defects within the left hepatic ducts. IMPRESSION: 1. Mobile filling defects within the left intrahepatic ducts may represent air bubbles introduced during the intraoperative cholangiogram versus small sludge balls, or less likely choledocholithiasis. No evidence of stones in the common bile duct. 2. Variant biliary ductal anatomy noted incidentally. Electronically Signed   By: Malachy Moan M.D.   On: 01/14/2020 10:01   ECHOCARDIOGRAM COMPLETE  Result Date: 01/15/2020    ECHOCARDIOGRAM REPORT   Patient Name:   David Nunez Date of Exam: 01/15/2020 Medical Rec #:  [id number]      Height:       68.0 in Accession #:    [id number]     Weight:       273.0 lb Date of Birth:  1946/06/26      BSA:          2.333 m Patient Age:    74 years       BP:           147/80 mmHg Patient Gender: M              HR:           82 bpm. Exam Location:  Inpatient Procedure: 2D Echo, Color Doppler, Cardiac Doppler and Intracardiac            Opacification Agent Indications:    Vision Loss  History:        Patient has no prior history of Echocardiogram examinations.                 Risk Factors:Hypertension.  Sonographer:    Irving Burton Senior RDCS Referring Phys: [id number] Drema Pry D NETTEY  Sonographer Comments: Technically difficult study due to patient body habitus. IMPRESSIONS  1. Left  ventricular ejection fraction, by estimation, is 60 to 65%. The left ventricle has normal function. The left ventricle has no regional wall motion abnormalities. There is mild concentric left ventricular hypertrophy. Left ventricular diastolic parameters are indeterminate.  2. Right ventricular systolic function is normal. The right ventricular size is normal.  3. The mitral valve is normal in  structure. No evidence of mitral valve regurgitation. No evidence of mitral stenosis.  4. The aortic valve is normal in structure. Aortic valve regurgitation is not visualized. No aortic stenosis is present.  5. The inferior vena cava is normal in size with greater than 50% respiratory variability, suggesting right atrial pressure of 3 mmHg. FINDINGS  Left Ventricle: Left ventricular ejection fraction, by estimation, is 60 to 65%. The left ventricle has normal function. The left ventricle has no regional wall motion abnormalities. Definity contrast agent was given IV to delineate the left ventricular  endocardial borders. The left ventricular internal cavity size was normal in size. There is mild concentric left ventricular hypertrophy. Left ventricular diastolic parameters are indeterminate. Right Ventricle: The right ventricular size is normal. No increase in right ventricular wall thickness. Right ventricular systolic function is normal. Left Atrium: Left atrial size was normal in size. Right Atrium: Right atrial size was normal in size. Pericardium: There is no evidence of pericardial effusion. Mitral Valve: The mitral valve is normal in structure. Normal mobility of the mitral valve leaflets. No evidence of mitral valve regurgitation. No evidence of mitral valve stenosis. Tricuspid Valve: The tricuspid valve is normal in structure. Tricuspid valve regurgitation is not demonstrated. No evidence of tricuspid stenosis. Aortic Valve: The aortic valve is normal in structure. Aortic valve regurgitation is not visualized. No  aortic stenosis is present. Pulmonic Valve: The pulmonic valve was normal in structure. Pulmonic valve regurgitation is not visualized. No evidence of pulmonic stenosis. Aorta: The aortic root is normal in size and structure. Venous: The inferior vena cava is normal in size with greater than 50% respiratory variability, suggesting right atrial pressure of 3 mmHg. IAS/Shunts: No atrial level shunt detected by color flow Doppler.  LEFT VENTRICLE PLAX 2D LVIDd:         4.10 cm LVIDs:         2.70 cm LV PW:         1.20 cm LV IVS:        1.20 cm LVOT diam:     2.10 cm LV SV:         55 LV SV Index:   24 LVOT Area:     3.46 cm  RIGHT VENTRICLE RV S prime:     13.90 cm/s TAPSE (M-mode): 2.6 cm LEFT ATRIUM             Index LA diam:        3.40 cm 1.46 cm/m LA Vol (A2C):   44.6 ml 19.12 ml/m LA Vol (A4C):   53.9 ml 23.11 ml/m LA Biplane Vol: 51.2 ml 21.95 ml/m  AORTIC VALVE LVOT Vmax:   77.75 cm/s LVOT Vmean:  59.000 cm/s LVOT VTI:    0.160 m  AORTA Ao Root diam: 3.70 cm Ao Asc diam:  3.30 cm  SHUNTS Systemic VTI:  0.16 m Systemic Diam: 2.10 cm Rachelle Hora Croitoru MD Electronically signed by Thurmon Fair MD Signature Date/Time: 01/15/2020/12:44:28 PM    Final    VAS US CAROTID  Result Date: 01/15/2020 Carotid Arterial Duplex Study Indications:  Transient vision loss. Risk Factors: Hypertension. Limitations   Today's exam was limited due to the body habitus of the patient               and the patient's respiratory variation. Performing Technologist: Chanda Busing RVT  Examination Guidelines: A complete evaluation includes B-mode imaging, spectral Doppler, color Doppler, and power Doppler as needed of all accessible portions of each vessel. Bilateral  testing is considered an integral part of a complete examination. Limited examinations for reoccurring indications may be performed as noted.  Right Carotid Findings: +----------+--------+--------+--------+-----------------------+--------+           PSV cm/sEDV  cm/sStenosisPlaque Description     Comments +----------+--------+--------+--------+-----------------------+--------+ CCA Prox  101     12              smooth and heterogenous         +----------+--------+--------+--------+-----------------------+--------+ CCA Distal35      9                                               +----------+--------+--------+--------+-----------------------+--------+ ICA Prox  29      14                                              +----------+--------+--------+--------+-----------------------+--------+ ICA Distal65      25                                     tortuous +----------+--------+--------+--------+-----------------------+--------+ ECA       54      8                                               +----------+--------+--------+--------+-----------------------+--------+ +----------+--------+-------+--------+-------------------+           PSV cm/sEDV cmsDescribeArm Pressure (mmHG) +----------+--------+-------+--------+-------------------+ [id number]                                        +----------+--------+-------+--------+-------------------+ +---------+--------+--+--------+--+---------+ VertebralPSV cm/s38EDV cm/s12Antegrade +---------+--------+--+--------+--+---------+  Left Carotid Findings: +----------+--------+--------+--------+-----------------------+--------+           PSV cm/sEDV cm/sStenosisPlaque Description     Comments +----------+--------+--------+--------+-----------------------+--------+ CCA Prox  93      18              smooth and heterogenous         +----------+--------+--------+--------+-----------------------+--------+ CCA Distal79      18              smooth and heterogenous         +----------+--------+--------+--------+-----------------------+--------+ ICA Prox  40      13              smooth and heterogenous          +----------+--------+--------+--------+-----------------------+--------+ ICA Distal47      14                                     tortuous +----------+--------+--------+--------+-----------------------+--------+ ECA       79      7                                               +----------+--------+--------+--------+-----------------------+--------+ +----------+--------+--------+--------+-------------------+  PSV cm/sEDV cm/sDescribeArm Pressure (mmHG) +----------+--------+--------+--------+-------------------+ Subclavian226                                         +----------+--------+--------+--------+-------------------+ +---------+--------+--+--------+-+---------+ VertebralPSV cm/s30EDV cm/s7Antegrade +---------+--------+--+--------+-+---------+   Summary: Right Carotid: Velocities in the right ICA are consistent with a 1-39% stenosis. Left Carotid: Velocities in the left ICA are consistent with a 1-39% stenosis. Vertebrals: Bilateral vertebral arteries demonstrate antegrade flow. *See table(s) above for measurements and observations.  Electronically signed by Fabienne Bruns MD on 01/15/2020 at 11:31:44 AM.    Final    VAS [place] UPPER EXTREMITY VENOUS DUPLEX  Result Date: 01/15/2020 UPPER VENOUS STUDY  Indications: Swelling Risk Factors: None identified. Limitations: Body habitus and poor ultrasound/tissue interface. Comparison Study: No prior studies. Performing Technologist: Chanda Busing RVT  Examination Guidelines: A complete evaluation includes B-mode imaging, spectral Doppler, color Doppler, and power Doppler as needed of all accessible portions of each vessel. Bilateral testing is considered an integral part of a complete examination. Limited examinations for reoccurring indications may be performed as noted.  Right Findings: +----------+------------+---------+-----------+----------+-------+ RIGHT     CompressiblePhasicitySpontaneousPropertiesSummary  +----------+------------+---------+-----------+----------+-------+ Subclavian    Full       Yes       Yes                      +----------+------------+---------+-----------+----------+-------+  Left Findings: +----------+------------+---------+-----------+----------+-------+ LEFT      CompressiblePhasicitySpontaneousPropertiesSummary +----------+------------+---------+-----------+----------+-------+ IJV           Full       Yes       Yes                      +----------+------------+---------+-----------+----------+-------+ Subclavian    Full       Yes       Yes                      +----------+------------+---------+-----------+----------+-------+ Axillary      Full       Yes       Yes                      +----------+------------+---------+-----------+----------+-------+ Brachial      Full       Yes       Yes                      +----------+------------+---------+-----------+----------+-------+ Radial        Full                                          +----------+------------+---------+-----------+----------+-------+ Ulnar         Full                                          +----------+------------+---------+-----------+----------+-------+ Cephalic      Full                                          +----------+------------+---------+-----------+----------+-------+ Basilic       Full                                          +----------+------------+---------+-----------+----------+-------+  Summary:  Right: No evidence of thrombosis in the subclavian.  Left: No evidence of deep vein thrombosis in the upper extremity. No evidence of superficial vein thrombosis in the upper extremity.  *See table(s) above for measurements and observations.  Diagnosing physician: Fabienne Bruns MD Electronically signed by Fabienne Bruns MD on 01/15/2020 at 11:31:33 AM.    Final    Scheduled Meds: . amLODipine  10 mg Oral Daily  . aspirin EC  81 mg Oral  Daily  . cholecalciferol  1,000 Units Oral Daily  . docusate sodium  100 mg Oral BID  . ferrous sulfate  325 mg Oral Q M,W,F  . pantoprazole (PROTONIX) IV  40 mg Intravenous QHS  . vitamin B-12  500 mcg Oral Daily   Continuous Infusions: . dextrose 5 % and 0.45 % NaCl with KCl 10 mEq/L 50 mL/hr at 01/16/20 0600  . piperacillin-tazobactam (ZOSYN)  IV Stopped (01/16/20 0415)     LOS: 4 days   Time spent:  Azucena Fallen, DO [facility]  If 7PM-7AM, please contact night-coverage [email or web address]  01/16/2020, 7:44 AM

## 2020-01-17 ENCOUNTER — Inpatient Hospital Stay (HOSPITAL_COMMUNITY): Payer: No Typology Code available for payment source

## 2020-01-17 LAB — COMPREHENSIVE METABOLIC PANEL
ALT: 24 U/L (ref 0–44)
AST: 16 U/L (ref 15–41)
Albumin: 2.4 g/dL — ABNORMAL LOW (ref 3.5–5.0)
Alkaline Phosphatase: 81 U/L (ref 38–126)
Anion gap: 6 (ref 5–15)
BUN: 15 mg/dL (ref 8–23)
CO2: 26 mmol/L (ref 22–32)
Calcium: 8.8 mg/dL — ABNORMAL LOW (ref 8.9–10.3)
Chloride: 102 mmol/L (ref 98–111)
Creatinine, Ser: 0.8 mg/dL (ref 0.61–1.24)
GFR calc Af Amer: >60 mL/min (ref >60)
GFR calc non Af Amer: >60 mL/min (ref >60)
Glucose, Bld: 119 mg/dL — ABNORMAL HIGH (ref 70–99)
Potassium: 4.1 mmol/L (ref 3.5–5.1)
Sodium: 134 mmol/L — ABNORMAL LOW (ref 135–145)
Total Bilirubin: 2.1 mg/dL — ABNORMAL HIGH (ref 0.3–1.2)
Total Protein: 5.7 g/dL — ABNORMAL LOW (ref 6.5–8.1)

## 2020-01-17 LAB — CBC
HCT: 37.4 % — ABNORMAL LOW (ref 39.0–52.0)
Hemoglobin: 12.4 g/dL — ABNORMAL LOW (ref 13.0–17.0)
MCH: 27 pg (ref 26.0–34.0)
MCHC: 33.2 g/dL (ref 30.0–36.0)
MCV: 81.5 fL (ref 80.0–100.0)
Platelets: 257 10*3/uL (ref 150–400)
RBC: 4.59 MIL/uL (ref 4.22–5.81)
RDW: 14.8 % (ref 11.5–15.5)
WBC: 18.8 10*3/uL — ABNORMAL HIGH (ref 4.0–10.5)
nRBC: 0.2 % (ref 0.0–0.2)

## 2020-01-17 MED ORDER — FUROSEMIDE 10 MG/ML IJ SOLN
40.0000 mg | Freq: Once | INTRAMUSCULAR | Status: AC
  Administered 2020-01-17: 40 mg via INTRAVENOUS
  Filled 2020-01-17: qty 4

## 2020-01-17 MED ORDER — POLYETHYLENE GLYCOL 3350 17 G PO PACK: 17.0000 g | PACK | Freq: Every day | ORAL | Status: DC | PRN

## 2020-01-17 MED ORDER — DOCUSATE SODIUM 100 MG PO CAPS
100.0000 mg | ORAL_CAPSULE | Freq: Two times a day (BID) | ORAL | Status: DC
  Administered 2020-01-19 – 2020-01-21 (×5): 100 mg via ORAL
  Filled 2020-01-17 (×6): qty 1

## 2020-01-17 MED ORDER — VITAMIN D 25 MCG (1000 UNIT) PO TABS
1000.0000 [IU] | ORAL_TABLET | Freq: Every day | ORAL | Status: DC
  Administered 2020-01-19 – 2020-01-21 (×3): 1000 [IU] via ORAL
  Filled 2020-01-17 (×4): qty 1

## 2020-01-17 MED ORDER — ACETAMINOPHEN 650 MG RE SUPP: 650.0000 mg | Freq: Four times a day (QID) | RECTAL | Status: DC | PRN

## 2020-01-17 MED ORDER — VITAMIN B-12 1000 MCG PO TABS
500.0000 ug | ORAL_TABLET | Freq: Every day | ORAL | Status: DC
  Administered 2020-01-19 – 2020-01-21 (×3): 500 ug via ORAL
  Filled 2020-01-17 (×4): qty 1

## 2020-01-17 MED ORDER — PROCHLORPERAZINE EDISYLATE 10 MG/2ML IJ SOLN: 5.0000 mg | Freq: Four times a day (QID) | INTRAMUSCULAR | Status: DC | PRN

## 2020-01-17 MED ORDER — ACETAMINOPHEN 325 MG PO TABS
650.0000 mg | ORAL_TABLET | Freq: Four times a day (QID) | ORAL | Status: DC | PRN
  Administered 2020-01-18 (×2): 650 mg via ORAL
  Filled 2020-01-17 (×2): qty 2

## 2020-01-17 MED ORDER — METHOCARBAMOL 1000 MG/10ML IJ SOLN
500.0000 mg | Freq: Three times a day (TID) | INTRAVENOUS | Status: DC
  Administered 2020-01-17 – 2020-01-21 (×12): 500 mg via INTRAVENOUS
  Filled 2020-01-17: qty 500
  Filled 2020-01-17: qty 5
  Filled 2020-01-17 (×4): qty 500
  Filled 2020-01-17: qty 5
  Filled 2020-01-17: qty 500
  Filled 2020-01-17: qty 5
  Filled 2020-01-17 (×4): qty 500

## 2020-01-17 MED ORDER — FERROUS SULFATE 325 (65 FE) MG PO TABS
325.0000 mg | ORAL_TABLET | ORAL | Status: DC
  Administered 2020-01-21: 325 mg via ORAL
  Filled 2020-01-17: qty 1

## 2020-01-17 MED ORDER — ASPIRIN EC 81 MG PO TBEC
81.0000 mg | DELAYED_RELEASE_TABLET | Freq: Every day | ORAL | Status: DC
  Administered 2020-01-19 – 2020-01-21 (×3): 81 mg via ORAL
  Filled 2020-01-17 (×4): qty 1

## 2020-01-17 MED ORDER — OXYCODONE HCL 5 MG PO TABS
5.0000 mg | ORAL_TABLET | ORAL | Status: DC | PRN
  Administered 2020-01-18 – 2020-01-21 (×9): 5 mg via ORAL
  Filled 2020-01-17 (×9): qty 1

## 2020-01-17 NOTE — Progress Notes (Signed)
PROGRESS NOTE    David Nunez  David Nunez:[id number] DOB: [birth year] DOA: 01/12/2020 PCP: [facility]   Brief Narrative:  David Nunez is an 74 y.o. male HTN, PUD, history of total knee replacement history of CVA (2009, 2012)who presented on 01/12/2020 with abdominal pain and emesis and found to have acute calculous cholecystitis limited bilirubin.  He underwent open cholecystectomy on 01/14/2020. Hospitalist consulted on 06/29 for sudden and acute of vision changes with spotting. Patient reports he seems to be doing well from abdominal standpoint after his cholecystectomy.  States earlier this morning noticed a black curtain crossing both feel the vision horizontally that lasted approximately 1 second.  Since then has had alternating white and black spots in his vision.  He denies any difficulty speaking or other new weakness.  He also noted increased swelling in his left arm at site of previous IV.  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  No source of cardiac embolus was identified at that time.  Prior to that in 2009 he also found to have several small acute nonhemorrhagic infarcts in left frontal/parietal lobe.  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 Given transient blurred vision post operatively that has now resolved it is unclear the cause. Neurology to see in outpatient setting for further workup and imaging given no acutely remarkable findings here on CT of the head CTA head/neck, or [place] of carotids.  At this time will sign off, if you have any ongoing questions or concerns please not hesitate to reach out and we will be happy to assist.  Assessment & Plan:   Principal Problem:   Cholelithiasis with acute cholecystitis Active Problems:   History of peptic ulcer disease   Hypertension    Cholecystitis, acute with cholelithiasis   Vision, loss, sudden   Spots in front of the eye   History of CVA (cerebrovascular accident)   Left arm swelling   Blistering   Sudden vision loss/changes, unlikely CVA, cannot rule out TIA - Patient not indicating his vision was previously blurry but now back to baseline; but does report seeing "spots" - no other notable deficits on exam - Carotid ultrasound bilateral with less than 40% narrowing - CT head/CTA head and neck unremarkable for acute findings - Previously discussed with Dr. Otelia Limes ( on- call neurologist) advises CTA head/neck, aspirin 81 mg and will need outpatient follow up. No need to transfer to [facility] unless an actual acute stroke has occurred  Left arm swelling and blisters - Left upper extremity DVT study negative  - Likely secondary to infiltration  - Elevate left upper extremity  HTN, essential - Transiently elevated overnight up to 174/84  - Continue home amlodipine 10 mg  Cholecystitis status post recent open cholecystectomy - Per primary   DVT prophylaxis: Per primary Code Status: Full Family Communication: None present  Status is: Inpatient  Dispo: The patient is from: Home              Anticipated d/c is to: Per primary              Anticipated d/c date is: Per primary              Patient currently not medically stable for discharge due to ongoing need for treatment and evaluation as above  Consultants:  We are  Procedures:   Diagnostic laparoscopy, open cholecystectomy with cholangiography and drain placement  Antimicrobials:  Per primary, Zosyn  Subjective: Episode of vomiting overnight, some concern for aspiration given patient's mental status and narcotics.  Patient currently appears well, denies any ongoing vision changes or deficits, nausea vomiting diarrhea constipation headache fevers or chills.  Objective: Vitals:   01/16/20 2222 01/17/20 0230 01/17/20 0617 01/17/20 0745  BP: (!)  142/91 133/88 (!) 149/89   Pulse: 84 86 82   Resp: 20 (!) 26 (!) 26 (!) 25  Temp: 97.9 F (36.6 C) 97.8 F (36.6 C) 98 F (36.7 C)   TempSrc: Oral Oral    SpO2: 97% 97% 93%   Weight:      Height:        Intake/Output Summary (Last 24 hours) at 01/17/2020 0800 Last data filed at 01/17/2020 0600 Gross per 24 hour  Intake 510.39 ml  Output 825 ml  Net -314.61 ml   Filed Weights   01/12/20 1205  Weight: 123.8 kg    Examination:  General:  Pleasantly resting in bed, No acute distress. HEENT:  Normocephalic atraumatic.  Sclerae nonicteric, noninjected.  Extraocular movements intact bilaterally. Neck:  Without mass or deformity.  Trachea is midline. Lungs:  Clear to auscultate bilaterally without rhonchi, wheeze, or rales. Heart:  Regular rate and rhythm.  Without murmurs, rubs, or gallops. Abdomen:  Soft, postop bandages clean dry intact, right upper quadrant drain intact draining bilious fluid. Extremities: Without cyanosis, clubbing, or obvious deformity. Vascular:  Dorsalis pedis and posterior tibial pulses palpable bilaterally. Skin:  Warm and dry, no erythema, no ulcerations.  Data Reviewed: I have personally reviewed following labs and imaging studies  CBC: Recent Labs  Lab 01/12/20 1209 01/13/20 0536 01/15/20 0505 01/16/20 0446 01/17/20 0354  WBC 24.9* 29.8* 23.1* 24.8* 18.8*  HGB 16.1 14.4 11.5* 12.7* 12.4*  HCT 47.7 42.9 34.9* 37.5* 37.4*  MCV 80.7 82.3 81.4 79.3* 81.5  PLT 178 173 180 233 257   Basic Metabolic Panel: Recent Labs  Lab 01/12/20 1209 01/13/20 0536 01/15/20 0505 01/16/20 0446 01/17/20 0354  NA 135 133* 132* 135 134*  K 4.0 4.0 4.4 4.0 4.1  CL 99 101 99 102 102  CO2 25 22 25 28 26   GLUCOSE 106* 113* 137* 145* 119*  BUN 10 14 19 19 15   CREATININE 0.99 1.27* 1.07 0.90 0.80  CALCIUM 9.5 8.8* 8.8* 9.0 8.8*   GFR: Estimated Creatinine Clearance: 107 mL/min (by C-G formula based on SCr of 0.8 mg/dL). Liver Function Tests: Recent Labs   Lab 01/12/20 1209 01/13/20 0536 01/15/20 0505 01/16/20 0446 01/17/20 0354  AST 17 15 23 17 16   ALT 16 17 23 21 24   ALKPHOS 95 82 88 90 81  BILITOT 3.0* 3.5* 2.1* 1.8* 2.1*  PROT 7.8 6.7 6.6 6.1* 5.7*  ALBUMIN 4.0 3.2* 2.6* 2.6* 2.4*   Recent Labs  Lab 01/12/20 1209  LIPASE 17   No results for input(s): AMMONIA in the last 168 hours. Coagulation Profile: No results for input(s): INR, PROTIME in the last 168 hours. Cardiac Enzymes: No results for input(s): CKTOTAL, CKMB, CKMBINDEX, TROPONINI in the last 168 hours. BNP (last 3 results) No results for input(s): PROBNP in the last 8760 hours. HbA1C: No results for input(s): HGBA1C in the last 72 hours. CBG: Recent Labs  Lab 01/14/20 1008  GLUCAP 128*   Lipid Profile: No results for input(s): CHOL, HDL, LDLCALC, TRIG, CHOLHDL, LDLDIRECT in the last 72 hours.  Thyroid Function Tests: No results for input(s): TSH, T4TOTAL, FREET4, T3FREE, THYROIDAB in the last 72 hours. Anemia Panel: No results for input(s): VITAMINB12, FOLATE, FERRITIN, TIBC, IRON, RETICCTPCT in the last 72 hours. Sepsis Labs: Recent Labs  Lab 01/12/20 1505 01/12/20 1745  LATICACIDVEN 1.7 1.3    Recent Results (from the past 240 hour(s))  SARS Coronavirus 2 by RT PCR (hospital order, performed in [facility] hospital lab) Nasopharyngeal Nasopharyngeal Swab     Status: None   Collection Time: 01/12/20  4:20 PM   Specimen: Nasopharyngeal Swab  Result Value Ref Range Status   SARS Coronavirus 2 NEGATIVE NEGATIVE Final    Comment: (NOTE) SARS-CoV-2 target nucleic acids are NOT DETECTED.  The SARS-CoV-2 RNA is generally detectable in upper and lower respiratory specimens during the acute phase of infection. The lowest concentration of SARS-CoV-2 viral copies this assay can detect is 250 copies / mL. A negative result does not preclude SARS-CoV-2 infection and should not be used as the sole basis for treatment or other patient management decisions.  A  negative result may occur with improper specimen collection / handling, submission of specimen other than nasopharyngeal swab, presence of viral mutation(s) within the areas targeted by this assay, and inadequate number of viral copies (<250 copies / mL). A negative result must be combined with clinical observations, patient history, and epidemiological information.  Fact Sheet for Patients:   BoilerBrush.com.cy  Fact Sheet for Healthcare Providers: https://pope.com/  This test is not yet approved or  cleared by the [place] FDA and has been authorized for detection and/or diagnosis of SARS-CoV-2 by FDA under an Emergency Use Authorization (EUA).  This EUA will remain in effect (meaning this test can be used) for the duration of the COVID-19 declaration under Section 564(b)(1) of the Act, 21 U.S.C. section 360bbb-3(b)(1), unless the authorization is terminated or revoked sooner.  Performed at [facility], [address] [place], [place] [address]   Surgical pcr screen     Status: None   Collection Time: 01/14/20 12:08 AM   Specimen: Nasal Mucosa; Nasal Swab  Result Value Ref Range Status   MRSA, PCR NEGATIVE NEGATIVE Final   Staphylococcus aureus NEGATIVE NEGATIVE Final    Comment: (NOTE) The Xpert SA Assay (FDA approved for NASAL specimens in patients 16 years of age and older), is one component of a comprehensive surveillance program. It is not intended to diagnose infection nor to guide or monitor treatment. Performed at [facility], [address] [place], [place] [address]          Radiology Studies: CT ANGIO HEAD W OR WO CONTRAST  Result Date: 01/15/2020 CLINICAL DATA:  Abnormal vision, history of aneurysm on MRA 2009 EXAM: CT ANGIOGRAPHY HEAD TECHNIQUE: Multidetector CT imaging of the head was performed using the standard protocol during bolus administration of  intravenous contrast. Multiplanar CT image reconstructions and MIPs were obtained to evaluate the vascular anatomy. CONTRAST:  OMNIPAQUE IOHEXOL 350 MG/ML SOLN COMPARISON:  Correlation made with MRA from 2009 FINDINGS: CT HEAD Brain: There is no acute intracranial hemorrhage, mass effect, or edema. Gray-white differentiation remains preserved. Ventricles and sulci are within normal limits in size and configuration. There is no extra-axial fluid collection. Vascular: No hyperdense vessel. Skull: Unremarkable. Sinuses: Minor paranasal sinus mucosal thickening. Orbits: Unremarkable. CTA HEAD Anterior circulation: Intracranial internal carotid arteries patent. 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 Exact relationship of the ophthalmic artery origin to the  aneurysm is unclear on this study. Comparison with the prior MRA is limited but there is likely no substantial change. Anterior cerebral arteries are patent. Anterior communicating artery is present. Right middle cerebral artery is patent. There is moderate stenosis at the left MCA origin with decreased caliber of the M1 segment. There is high-grade stenosis of the left posterior MCA division origin with partial reconstitution, noting diminished caliber of distal left MCA branches as compared to the right. This stenosis has progressed since the 2009 MRA. Posterior circulation: Intracranial vertebral arteries are patent. Right vertebral artery is dominant with minimal calcified plaque. Basilar artery is patent. Posterior cerebral arteries are patent. A left posterior communicating artery is present. Venous sinuses: Not well evaluated. IMPRESSION: Approximately 2 mm left periophthalmic aneurysm. Comparison is difficult but likely no substantial change since 2009 MRA. 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 No acute intracranial abnormality. Electronically Signed   By: Guadlupe Spanish M.D.   On: 01/15/2020 16:05   CT ANGIO NECK W OR WO CONTRAST  Result Date: 01/16/2020 CLINICAL DATA:  Transient loss of vision EXAM: CT ANGIOGRAPHY NECK TECHNIQUE: Multidetector CT imaging of the neck was performed using the standard protocol during bolus administration of intravenous contrast. Multiplanar CT image reconstructions and MIPs were obtained to evaluate the vascular anatomy. Carotid stenosis measurements (when applicable) are obtained utilizing NASCET criteria, using the distal internal carotid diameter as the denominator. CONTRAST:  OMNIPAQUE IOHEXOL 350 MG/ML SOLN COMPARISON:  None. FINDINGS: Aortic arch: Calcified plaque along the aortic arch. Great vessel origins patent. Right carotid system: Patent. No measurable stenosis at the ICA origin. No evidence of dissection. There is partially retropharyngeal course. Left carotid system: Patent. No measurable stenosis at the ICA origin. No evidence of dissection. Partially retropharyngeal course. Vertebral arteries: Extracranial vertebral arteries are patent. Right vertebral artery is dominant. Skeleton: Advanced degenerative changes of the cervical spine. Other neck: No mass or adenopathy. Upper chest: No apical lung mass. IMPRESSION: No large vessel occlusion, hemodynamically significant stenosis, or evidence of dissection. Electronically Signed   By: Guadlupe Spanish M.D.   On: 01/16/2020 11:51   ECHOCARDIOGRAM COMPLETE  Result Date: 01/15/2020    ECHOCARDIOGRAM REPORT   Patient Name:   David Nunez Date of Exam: 01/15/2020 Medical Rec #:  [id number]      Height:       68.0 in Accession #:    [id number]     Weight:       273.0 lb Date of Birth:  03/21/1946      BSA:          2.333 m Patient Age:    74 years       BP:           147/80 mmHg Patient Gender: M              HR:           82 bpm. Exam Location:  Inpatient  Procedure: 2D Echo, Color Doppler, Cardiac Doppler and Intracardiac            Opacification Agent Indications:    Vision Loss  History:        Patient has no prior history of Echocardiogram examinations.                 Risk Factors:Hypertension.  Sonographer:  [facility] Senior RDCS Referring Phys: [id number] [facility] D NETTEY  Sonographer Comments: Technically difficult study due to patient body habitus. IMPRESSIONS  1. Left ventricular ejection fraction, by estimation, is 60 to 65%. The left ventricle has normal function. The left ventricle has no regional wall motion abnormalities. There is mild concentric left ventricular hypertrophy. Left ventricular diastolic parameters are indeterminate.  2. Right ventricular systolic function is normal. The right ventricular size is normal.  3. The mitral valve is normal in structure. No evidence of mitral valve regurgitation. No evidence of mitral stenosis.  4. The aortic valve is normal in structure. Aortic valve regurgitation is not visualized. No aortic stenosis is present.  5. The inferior vena cava is normal in size with greater than 50% respiratory variability, suggesting right atrial pressure of 3 mmHg. FINDINGS  Left Ventricle: Left ventricular ejection fraction, by estimation, is 60 to 65%. The left ventricle has normal function. The left ventricle has no regional wall motion abnormalities. Definity contrast agent was given IV to delineate the left ventricular  endocardial borders. The left ventricular internal cavity size was normal in size. There is mild concentric left ventricular hypertrophy. Left ventricular diastolic parameters are indeterminate. Right Ventricle: The right ventricular size is normal. No increase in right ventricular wall thickness. Right ventricular systolic function is normal. Left Atrium: Left atrial size was normal in size. Right Atrium: Right atrial size was normal in size. Pericardium: There is no evidence of pericardial effusion. Mitral  Valve: The mitral valve is normal in structure. Normal mobility of the mitral valve leaflets. No evidence of mitral valve regurgitation. No evidence of mitral valve stenosis. Tricuspid Valve: The tricuspid valve is normal in structure. Tricuspid valve regurgitation is not demonstrated. No evidence of tricuspid stenosis. Aortic Valve: The aortic valve is normal in structure. Aortic valve regurgitation is not visualized. No aortic stenosis is present. Pulmonic Valve: The pulmonic valve was normal in structure. Pulmonic valve regurgitation is not visualized. No evidence of pulmonic stenosis. Aorta: The aortic root is normal in size and structure. Venous: The inferior vena cava is normal in size with greater than 50% respiratory variability, suggesting right atrial pressure of 3 mmHg. IAS/Shunts: No atrial level shunt detected by color flow Doppler.  LEFT VENTRICLE PLAX 2D LVIDd:         4.10 cm LVIDs:         2.70 cm LV PW:         1.20 cm LV IVS:        1.20 cm LVOT diam:     2.10 cm LV SV:         55 LV SV Index:   24 LVOT Area:     3.46 cm  RIGHT VENTRICLE RV S prime:     13.90 cm/s TAPSE (M-mode): 2.6 cm LEFT ATRIUM             Index LA diam:        3.40 cm 1.46 cm/m LA Vol (A2C):   44.6 ml 19.12 ml/m LA Vol (A4C):   53.9 ml 23.11 ml/m LA Biplane Vol: 51.2 ml 21.95 ml/m  AORTIC VALVE LVOT Vmax:   77.75 cm/s LVOT Vmean:  59.000 cm/s LVOT VTI:    0.160 m  AORTA Ao Root diam: 3.70 cm Ao Asc diam:  3.30 cm  SHUNTS Systemic VTI:  0.16 m Systemic Diam: 2.10 cm Rachelle Hora Croitoru MD Electronically signed by Thurmon Fair MD Signature Date/Time: 01/15/2020/12:44:28 PM    Final    VAS  US CAROTID  Result Date: 01/15/2020 Carotid Arterial Duplex Study Indications:  Transient vision loss. Risk Factors: Hypertension. Limitations   Today's exam was limited due to the body habitus of the patient               and the patient's respiratory variation. Performing Technologist: Chanda BusingGregory Collins RVT  Examination Guidelines: A  complete evaluation includes B-mode imaging, spectral Doppler, color Doppler, and power Doppler as needed of all accessible portions of each vessel. Bilateral testing is considered an integral part of a complete examination. Limited examinations for reoccurring indications may be performed as noted.  Right Carotid Findings: +----------+--------+--------+--------+-----------------------+--------+           PSV cm/sEDV cm/sStenosisPlaque Description     Comments +----------+--------+--------+--------+-----------------------+--------+ CCA Prox  101     12              smooth and heterogenous         +----------+--------+--------+--------+-----------------------+--------+ CCA Distal35      9                                               +----------+--------+--------+--------+-----------------------+--------+ ICA Prox  29      14                                              +----------+--------+--------+--------+-----------------------+--------+ ICA Distal65      25                                     tortuous +----------+--------+--------+--------+-----------------------+--------+ ECA       54      8                                               +----------+--------+--------+--------+-----------------------+--------+ +----------+--------+-------+--------+-------------------+           PSV cm/sEDV cmsDescribeArm Pressure (mmHG) +----------+--------+-------+--------+-------------------+ [id number]                                        +----------+--------+-------+--------+-------------------+ +---------+--------+--+--------+--+---------+ VertebralPSV cm/s38EDV cm/s12Antegrade +---------+--------+--+--------+--+---------+  Left Carotid Findings: +----------+--------+--------+--------+-----------------------+--------+           PSV cm/sEDV cm/sStenosisPlaque Description     Comments  +----------+--------+--------+--------+-----------------------+--------+ CCA Prox  93      18              smooth and heterogenous         +----------+--------+--------+--------+-----------------------+--------+ CCA Distal79      18              smooth and heterogenous         +----------+--------+--------+--------+-----------------------+--------+ ICA Prox  40      13              smooth and heterogenous         +----------+--------+--------+--------+-----------------------+--------+ ICA Distal47      14  tortuous +----------+--------+--------+--------+-----------------------+--------+ ECA       79      7                                               +----------+--------+--------+--------+-----------------------+--------+ +----------+--------+--------+--------+-------------------+           PSV cm/sEDV cm/sDescribeArm Pressure (mmHG) +----------+--------+--------+--------+-------------------+ Subclavian226                                         +----------+--------+--------+--------+-------------------+ +---------+--------+--+--------+-+---------+ VertebralPSV cm/s30EDV cm/s7Antegrade +---------+--------+--+--------+-+---------+   Summary: Right Carotid: Velocities in the right ICA are consistent with a 1-39% stenosis. Left Carotid: Velocities in the left ICA are consistent with a 1-39% stenosis. Vertebrals: Bilateral vertebral arteries demonstrate antegrade flow. *See table(s) above for measurements and observations.  Electronically signed by Fabienne Bruns MD on 01/15/2020 at 11:31:44 AM.    Final    VAS [place] UPPER EXTREMITY VENOUS DUPLEX  Result Date: 01/15/2020 UPPER VENOUS STUDY  Indications: Swelling Risk Factors: None identified. Limitations: Body habitus and poor ultrasound/tissue interface. Comparison Study: No prior studies. Performing Technologist: Chanda Busing RVT  Examination Guidelines: A complete evaluation  includes B-mode imaging, spectral Doppler, color Doppler, and power Doppler as needed of all accessible portions of each vessel. Bilateral testing is considered an integral part of a complete examination. Limited examinations for reoccurring indications may be performed as noted.  Right Findings: +----------+------------+---------+-----------+----------+-------+ RIGHT     CompressiblePhasicitySpontaneousPropertiesSummary +----------+------------+---------+-----------+----------+-------+ Subclavian    Full       Yes       Yes                      +----------+------------+---------+-----------+----------+-------+  Left Findings: +----------+------------+---------+-----------+----------+-------+ LEFT      CompressiblePhasicitySpontaneousPropertiesSummary +----------+------------+---------+-----------+----------+-------+ IJV           Full       Yes       Yes                      +----------+------------+---------+-----------+----------+-------+ Subclavian    Full       Yes       Yes                      +----------+------------+---------+-----------+----------+-------+ Axillary      Full       Yes       Yes                      +----------+------------+---------+-----------+----------+-------+ Brachial      Full       Yes       Yes                      +----------+------------+---------+-----------+----------+-------+ Radial        Full                                          +----------+------------+---------+-----------+----------+-------+ Ulnar         Full                                          +----------+------------+---------+-----------+----------+-------+  Cephalic      Full                                          +----------+------------+---------+-----------+----------+-------+ Basilic       Full                                          +----------+------------+---------+-----------+----------+-------+  Summary:  Right: No  evidence of thrombosis in the subclavian.  Left: No evidence of deep vein thrombosis in the upper extremity. No evidence of superficial vein thrombosis in the upper extremity.  *See table(s) above for measurements and observations.  Diagnosing physician: Fabienne Bruns MD Electronically signed by Fabienne Bruns MD on 01/15/2020 at 11:31:33 AM.    Final    Scheduled Meds: . amLODipine  10 mg Oral Daily  . aspirin EC  81 mg Oral Daily  . cholecalciferol  1,000 Units Oral Daily  . docusate sodium  100 mg Oral BID  . enoxaparin (LOVENOX) injection  40 mg Subcutaneous Q24H  . ferrous sulfate  325 mg Oral Q M,W,F  . pantoprazole (PROTONIX) IV  40 mg Intravenous QHS  . vitamin B-12  500 mcg Oral Daily   Continuous Infusions: . dextrose 5 % and 0.45 % NaCl with KCl 10 mEq/L 50 mL/hr at 01/16/20 1700  . piperacillin-tazobactam (ZOSYN)  IV 3.375 g (01/17/20 0001)     LOS: 5 days   Time spent:  Azucena Fallen, DO [facility]  If 7PM-7AM, please contact night-coverage [email or web address]  01/17/2020, 8:00 AM

## 2020-01-17 NOTE — Progress Notes (Signed)
[facility] Progress Note  3 Days Post-Op  Subjective: Patient reports some SOB this AM. Per RN he vomited once overnight. Patient denies nausea this AM. Having a little more pain in RUQ. High output from JP drain yesterday. Still seeing some spots but reports this is improving overall. Denies new numbness or tingling.   Objective: Vital signs in last 24 hours: Temp:  [97.6 F (36.4 C)-98.3 F (36.8 C)] 98 F (36.7 C) (07/01 0617) Pulse Rate:  [79-86] 82 (07/01 0617) Resp:  [20-37] 25 (07/01 0745) BP: (133-149)/(82-102) 149/89 (07/01 0617) SpO2:  [93 %-97 %] 93 % (07/01 0617) Last BM Date: 01/12/20  Intake/Output from previous day: 06/30 0701 - 07/01 0700 In: 510.4 [I.V.:460.6; IV Piggyback:49.8] Out: 825 [Drains:825] Intake/Output this shift: No intake/output data recorded.  PE: General: pleasant, WD, obese male who is laying in bed in NAD HEENT:  Sclera are anicteric.  PERRL.  Ears and nose without any masses or lesions.  Mouth is pink and moist Heart: regular, rate, and rhythm. Palpable radial and pedal pulses bilaterally Lungs: tachypnea, CTAB Abd: soft, appropriately ttp, distended, umbilical incision c/d/i, RUQ incision with honeycomb present, drain in RUQ with bilious appearing drainage MS: all 4 extremities are symmetrical with no cyanosis, clubbing; trace edema in hands and BLEs Skin: warm and dry with no masses, lesions, or rashes Neuro: Cranial nerves 2-12 grossly intact, sensation grossly intact throughout  Psych: A&Ox3 with an appropriate affect.   Lab Results:  Recent Labs    01/16/20 0446 01/17/20 0354  WBC 24.8* 18.8*  HGB 12.7* 12.4*  HCT 37.5* 37.4*  PLT 233 257   BMET Recent Labs    01/16/20 0446 01/17/20 0354  NA 135 134*  K 4.0 4.1  CL 102 102  CO2 28 26  GLUCOSE 145* 119*  BUN 19 15  CREATININE 0.90 0.80  CALCIUM 9.0 8.8*   PT/INR No results for input(s): LABPROT, INR in the last 72 hours. CMP     Component Value  Date/Time   NA 134 (L) 01/17/2020 0354   K 4.1 01/17/2020 0354   CL 102 01/17/2020 0354   CO2 26 01/17/2020 0354   GLUCOSE 119 (H) 01/17/2020 0354   BUN 15 01/17/2020 0354   CREATININE 0.80 01/17/2020 0354   CALCIUM 8.8 (L) 01/17/2020 0354   PROT 5.7 (L) 01/17/2020 0354   ALBUMIN 2.4 (L) 01/17/2020 0354   AST 16 01/17/2020 0354   ALT 24 01/17/2020 0354   ALKPHOS 81 01/17/2020 0354   BILITOT 2.1 (H) 01/17/2020 0354   GFRNONAA >60 01/17/2020 0354   GFRAA >60 01/17/2020 0354   Lipase     Component Value Date/Time   LIPASE 17 01/12/2020 1209       Studies/Results: CT ANGIO HEAD W OR WO CONTRAST  Result Date: 01/15/2020 CLINICAL DATA:  Abnormal vision, history of aneurysm on MRA 2009 EXAM: CT ANGIOGRAPHY HEAD TECHNIQUE: Multidetector CT imaging of the head was performed using the standard protocol during bolus administration of intravenous contrast. Multiplanar CT image reconstructions and MIPs were obtained to evaluate the vascular anatomy. CONTRAST:  OMNIPAQUE IOHEXOL 350 MG/ML SOLN COMPARISON:  Correlation made with MRA from 2009 FINDINGS: CT HEAD Brain: There is no acute intracranial hemorrhage, mass effect, or edema. Gray-white differentiation remains preserved. Ventricles and sulci are within normal limits in size and configuration. There is no extra-axial fluid collection. Vascular: No hyperdense vessel. Skull: Unremarkable. Sinuses: Minor paranasal sinus mucosal thickening. Orbits: Unremarkable. CTA HEAD Anterior circulation: Intracranial internal  carotid arteries patent. 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 Exact relationship of the ophthalmic artery origin to the aneurysm is unclear on this study. Comparison with the prior MRA is limited but there is likely no substantial change. Anterior cerebral arteries are patent. Anterior communicating artery is present. Right middle cerebral artery is patent. There is moderate stenosis at the  left MCA origin with decreased caliber of the M1 segment. There is high-grade stenosis of the left posterior MCA division origin with partial reconstitution, noting diminished caliber of distal left MCA branches as compared to the right. This stenosis has progressed since the 2009 MRA. Posterior circulation: Intracranial vertebral arteries are patent. Right vertebral artery is dominant with minimal calcified plaque. Basilar artery is patent. Posterior cerebral arteries are patent. A left posterior communicating artery is present. Venous sinuses: Not well evaluated. IMPRESSION: Approximately 2 mm left periophthalmic aneurysm. Comparison is difficult but likely no substantial change since 2009 MRA. 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 No acute intracranial abnormality. Electronically Signed   By: Guadlupe Spanish M.D.   On: 01/15/2020 16:05   CT ANGIO NECK W OR WO CONTRAST  Result Date: 01/16/2020 CLINICAL DATA:  Transient loss of vision EXAM: CT ANGIOGRAPHY NECK TECHNIQUE: Multidetector CT imaging of the neck was performed using the standard protocol during bolus administration of intravenous contrast. Multiplanar CT image reconstructions and MIPs were obtained to evaluate the vascular anatomy. Carotid stenosis measurements (when applicable) are obtained utilizing NASCET criteria, using the distal internal carotid diameter as the denominator. CONTRAST:  OMNIPAQUE IOHEXOL 350 MG/ML SOLN COMPARISON:  None. FINDINGS: Aortic arch: Calcified plaque along the aortic arch. Great vessel origins patent. Right carotid system: Patent. No measurable stenosis at the ICA origin. No evidence of dissection. There is partially retropharyngeal course. Left carotid system: Patent. No measurable stenosis at the ICA origin. No evidence of dissection. Partially  retropharyngeal course. Vertebral arteries: Extracranial vertebral arteries are patent. Right vertebral artery is dominant. Skeleton: Advanced degenerative changes of the cervical spine. Other neck: No mass or adenopathy. Upper chest: No apical lung mass. IMPRESSION: No large vessel occlusion, hemodynamically significant stenosis, or evidence of dissection. Electronically Signed   By: Guadlupe Spanish M.D.   On: 01/16/2020 11:51   ECHOCARDIOGRAM COMPLETE  Result Date: 01/15/2020    ECHOCARDIOGRAM REPORT   Patient Name:   VIRGEL HARO Date of Exam: 01/15/2020 Medical Rec #:  [id number]      Height:       68.0 in Accession #:    [id number]     Weight:       273.0 lb Date of Birth:  1946-05-01      BSA:          2.333 m Patient Age:    74 years       BP:           147/80 mmHg Patient Gender: M              HR:           82 bpm. Exam Location:  Inpatient Procedure: 2D Echo, Color Doppler, Cardiac Doppler and Intracardiac            Opacification Agent Indications:    Vision Loss  History:        Patient has no prior history of Echocardiogram  examinations.                 Risk Factors:Hypertension.  Sonographer:    Irving Burton Senior RDCS Referring Phys: [id number] Drema Pry D NETTEY  Sonographer Comments: Technically difficult study due to patient body habitus. IMPRESSIONS  1. Left ventricular ejection fraction, by estimation, is 60 to 65%. The left ventricle has normal function. The left ventricle has no regional wall motion abnormalities. There is mild concentric left ventricular hypertrophy. Left ventricular diastolic parameters are indeterminate.  2. Right ventricular systolic function is normal. The right ventricular size is normal.  3. The mitral valve is normal in structure. No evidence of mitral valve regurgitation. No evidence of mitral stenosis.  4. The aortic valve is normal in structure. Aortic valve regurgitation is not visualized. No aortic stenosis is present.  5. The inferior vena cava is normal in size with  greater than 50% respiratory variability, suggesting right atrial pressure of 3 mmHg. FINDINGS  Left Ventricle: Left ventricular ejection fraction, by estimation, is 60 to 65%. The left ventricle has normal function. The left ventricle has no regional wall motion abnormalities. Definity contrast agent was given IV to delineate the left ventricular  endocardial borders. The left ventricular internal cavity size was normal in size. There is mild concentric left ventricular hypertrophy. Left ventricular diastolic parameters are indeterminate. Right Ventricle: The right ventricular size is normal. No increase in right ventricular wall thickness. Right ventricular systolic function is normal. Left Atrium: Left atrial size was normal in size. Right Atrium: Right atrial size was normal in size. Pericardium: There is no evidence of pericardial effusion. Mitral Valve: The mitral valve is normal in structure. Normal mobility of the mitral valve leaflets. No evidence of mitral valve regurgitation. No evidence of mitral valve stenosis. Tricuspid Valve: The tricuspid valve is normal in structure. Tricuspid valve regurgitation is not demonstrated. No evidence of tricuspid stenosis. Aortic Valve: The aortic valve is normal in structure. Aortic valve regurgitation is not visualized. No aortic stenosis is present. Pulmonic Valve: The pulmonic valve was normal in structure. Pulmonic valve regurgitation is not visualized. No evidence of pulmonic stenosis. Aorta: The aortic root is normal in size and structure. Venous: The inferior vena cava is normal in size with greater than 50% respiratory variability, suggesting right atrial pressure of 3 mmHg. IAS/Shunts: No atrial level shunt detected by color flow Doppler.  LEFT VENTRICLE PLAX 2D LVIDd:         4.10 cm LVIDs:         2.70 cm LV PW:         1.20 cm LV IVS:        1.20 cm LVOT diam:     2.10 cm LV SV:         55 LV SV Index:   24 LVOT Area:     3.46 cm  RIGHT VENTRICLE RV S  prime:     13.90 cm/s TAPSE (M-mode): 2.6 cm LEFT ATRIUM             Index LA diam:        3.40 cm 1.46 cm/m LA Vol (A2C):   44.6 ml 19.12 ml/m LA Vol (A4C):   53.9 ml 23.11 ml/m LA Biplane Vol: 51.2 ml 21.95 ml/m  AORTIC VALVE LVOT Vmax:   77.75 cm/s LVOT Vmean:  59.000 cm/s LVOT VTI:    0.160 m  AORTA Ao Root diam: 3.70 cm Ao Asc diam:  3.30 cm  SHUNTS Systemic VTI:  0.16 m  Systemic Diam: 2.10 cm Thurmon Fair MD Electronically signed by Thurmon Fair MD Signature Date/Time: 01/15/2020/12:44:28 PM    Final    VAS US CAROTID  Result Date: 01/15/2020 Carotid Arterial Duplex Study Indications:  Transient vision loss. Risk Factors: Hypertension. Limitations   Today's exam was limited due to the body habitus of the patient               and the patient's respiratory variation. Performing Technologist: Chanda Busing RVT  Examination Guidelines: A complete evaluation includes B-mode imaging, spectral Doppler, color Doppler, and power Doppler as needed of all accessible portions of each vessel. Bilateral testing is considered an integral part of a complete examination. Limited examinations for reoccurring indications may be performed as noted.  Right Carotid Findings: +----------+--------+--------+--------+-----------------------+--------+           PSV cm/sEDV cm/sStenosisPlaque Description     Comments +----------+--------+--------+--------+-----------------------+--------+ CCA Prox  101     12              smooth and heterogenous         +----------+--------+--------+--------+-----------------------+--------+ CCA Distal35      9                                               +----------+--------+--------+--------+-----------------------+--------+ ICA Prox  29      14                                              +----------+--------+--------+--------+-----------------------+--------+ ICA Distal65      25                                     tortuous  +----------+--------+--------+--------+-----------------------+--------+ ECA       54      8                                               +----------+--------+--------+--------+-----------------------+--------+ +----------+--------+-------+--------+-------------------+           PSV cm/sEDV cmsDescribeArm Pressure (mmHG) +----------+--------+-------+--------+-------------------+ [id number]                                        +----------+--------+-------+--------+-------------------+ +---------+--------+--+--------+--+---------+ VertebralPSV cm/s38EDV cm/s12Antegrade +---------+--------+--+--------+--+---------+  Left Carotid Findings: +----------+--------+--------+--------+-----------------------+--------+           PSV cm/sEDV cm/sStenosisPlaque Description     Comments +----------+--------+--------+--------+-----------------------+--------+ CCA Prox  93      18              smooth and heterogenous         +----------+--------+--------+--------+-----------------------+--------+ CCA Distal79      18              smooth and heterogenous         +----------+--------+--------+--------+-----------------------+--------+ ICA Prox  40      13              smooth and heterogenous         +----------+--------+--------+--------+-----------------------+--------+ ICA Distal47  14                                     tortuous +----------+--------+--------+--------+-----------------------+--------+ ECA       79      7                                               +----------+--------+--------+--------+-----------------------+--------+ +----------+--------+--------+--------+-------------------+           PSV cm/sEDV cm/sDescribeArm Pressure (mmHG) +----------+--------+--------+--------+-------------------+ Subclavian226                                         +----------+--------+--------+--------+-------------------+  +---------+--------+--+--------+-+---------+ VertebralPSV cm/s30EDV cm/s7Antegrade +---------+--------+--+--------+-+---------+   Summary: Right Carotid: Velocities in the right ICA are consistent with a 1-39% stenosis. Left Carotid: Velocities in the left ICA are consistent with a 1-39% stenosis. Vertebrals: Bilateral vertebral arteries demonstrate antegrade flow. *See table(s) above for measurements and observations.  Electronically signed by Fabienne Bruns MD on 01/15/2020 at 11:31:44 AM.    Final    VAS [place] UPPER EXTREMITY VENOUS DUPLEX  Result Date: 01/15/2020 UPPER VENOUS STUDY  Indications: Swelling Risk Factors: None identified. Limitations: Body habitus and poor ultrasound/tissue interface. Comparison Study: No prior studies. Performing Technologist: Chanda Busing RVT  Examination Guidelines: A complete evaluation includes B-mode imaging, spectral Doppler, color Doppler, and power Doppler as needed of all accessible portions of each vessel. Bilateral testing is considered an integral part of a complete examination. Limited examinations for reoccurring indications may be performed as noted.  Right Findings: +----------+------------+---------+-----------+----------+-------+ RIGHT     CompressiblePhasicitySpontaneousPropertiesSummary +----------+------------+---------+-----------+----------+-------+ Subclavian    Full       Yes       Yes                      +----------+------------+---------+-----------+----------+-------+  Left Findings: +----------+------------+---------+-----------+----------+-------+ LEFT      CompressiblePhasicitySpontaneousPropertiesSummary +----------+------------+---------+-----------+----------+-------+ IJV           Full       Yes       Yes                      +----------+------------+---------+-----------+----------+-------+ Subclavian    Full       Yes       Yes                       +----------+------------+---------+-----------+----------+-------+ Axillary      Full       Yes       Yes                      +----------+------------+---------+-----------+----------+-------+ Brachial      Full       Yes       Yes                      +----------+------------+---------+-----------+----------+-------+ Radial        Full                                          +----------+------------+---------+-----------+----------+-------+  Ulnar         Full                                          +----------+------------+---------+-----------+----------+-------+ Cephalic      Full                                          +----------+------------+---------+-----------+----------+-------+ Basilic       Full                                          +----------+------------+---------+-----------+----------+-------+  Summary:  Right: No evidence of thrombosis in the subclavian.  Left: No evidence of deep vein thrombosis in the upper extremity. No evidence of superficial vein thrombosis in the upper extremity.  *See table(s) above for measurements and observations.  Diagnosing physician: Fabienne Brunsharles Fields MD Electronically signed by Fabienne Brunsharles Fields MD on 01/15/2020 at 11:31:33 AM.    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 Darnell Levelodd Gerkin - POD#3 - WBC 18, afebrile, continue to monitor - continue IV abx - Tbili up slightly 2.1 from 1.8, drain appears bilious and had >800 cc out in 24h - get HIDA today to r/o bile leak - pain reasonably well controlled, work on mobilizing  FEN: IV fluids, NPO for HIDA ID: Zosyn 6/26>> DVT: SCDs, lovenox Follow-up: Dr. Gerrit FriendsGerkin  LOS: 5 days    Juliet RudeKelly R Koraline Phillipson , [facility] 01/17/2020, 8:31 AM Please see Amion for pager number during day hours 7:00am-4:30pm

## 2020-01-17 NOTE — Progress Notes (Signed)
   01/17/20 0230  Assess: MEWS Score  Temp 97.8 F (36.6 C)  BP 133/88  Pulse Rate 86  Resp (!) 26  SpO2 97 %  O2 Device Room Air  Assess: MEWS Score  MEWS Temp 0  MEWS Systolic 0  MEWS Pulse 0  MEWS RR 2  MEWS LOC 0  MEWS Score 2  MEWS Score Color Yellow  Assess: if the MEWS score is Yellow or Red  Were vital signs taken at a resting state? Yes  Focused Assessment Documented focused assessment  MEWS guidelines implemented *See Row Information* No, previously yellow, continue vital signs every 4 hours

## 2020-01-18 ENCOUNTER — Inpatient Hospital Stay (HOSPITAL_COMMUNITY): Payer: No Typology Code available for payment source

## 2020-01-18 LAB — CBC
HCT: 35.2 % — ABNORMAL LOW (ref 39.0–52.0)
Hemoglobin: 12 g/dL — ABNORMAL LOW (ref 13.0–17.0)
MCH: 27.4 pg (ref 26.0–34.0)
MCHC: 34.1 g/dL (ref 30.0–36.0)
MCV: 80.4 fL (ref 80.0–100.0)
Platelets: 172 10*3/uL (ref 150–400)
RBC: 4.38 MIL/uL (ref 4.22–5.81)
RDW: 14.9 % (ref 11.5–15.5)
WBC: 15.2 10*3/uL — ABNORMAL HIGH (ref 4.0–10.5)
nRBC: 1.5 % — ABNORMAL HIGH (ref 0.0–0.2)

## 2020-01-18 LAB — COMPREHENSIVE METABOLIC PANEL
ALT: 49 U/L — ABNORMAL HIGH (ref 0–44)
AST: 35 U/L (ref 15–41)
Albumin: 2.4 g/dL — ABNORMAL LOW (ref 3.5–5.0)
Alkaline Phosphatase: 78 U/L (ref 38–126)
Anion gap: 9 (ref 5–15)
BUN: 12 mg/dL (ref 8–23)
CO2: 26 mmol/L (ref 22–32)
Calcium: 8.7 mg/dL — ABNORMAL LOW (ref 8.9–10.3)
Chloride: 101 mmol/L (ref 98–111)
Creatinine, Ser: 0.88 mg/dL (ref 0.61–1.24)
GFR calc Af Amer: >60 mL/min (ref >60)
GFR calc non Af Amer: >60 mL/min (ref >60)
Glucose, Bld: 102 mg/dL — ABNORMAL HIGH (ref 70–99)
Potassium: 3.9 mmol/L (ref 3.5–5.1)
Sodium: 136 mmol/L (ref 135–145)
Total Bilirubin: 1.9 mg/dL — ABNORMAL HIGH (ref 0.3–1.2)
Total Protein: 5.5 g/dL — ABNORMAL LOW (ref 6.5–8.1)

## 2020-01-18 MED ORDER — TECHNETIUM TC 99M MEBROFENIN IV KIT
4.9800 | PACK | Freq: Once | INTRAVENOUS | Status: AC
  Administered 2020-01-18: 4.98 via INTRAVENOUS

## 2020-01-18 NOTE — Plan of Care (Signed)
  Problem: Education: Goal: Knowledge of General Education information will improve Description Including pain rating scale, medication(s)/side effects and non-pharmacologic comfort measures Outcome: Progressing   Problem: Nutrition: Goal: Adequate nutrition will be maintained Outcome: Progressing   Problem: Pain Managment: Goal: General experience of comfort will improve Outcome: Progressing   

## 2020-01-18 NOTE — Progress Notes (Signed)
PT Cancellation Note  Patient Details Name: David Nunez MRN: [id number] DOB: 26-Sep-1945   Cancelled Treatment:    Reason Eval/Treat Not Completed: Other (comment) Pt declined PT due to has to been fasting all day for a test and does not have the energy.  Will f/u as able. Encouraged ambulation with nursing if he felt better later. Anise Salvo, PT Acute Rehab Services Pager [phone] [facility] [phone]    Rayetta Humphrey 01/18/2020, 3:14 PM

## 2020-01-18 NOTE — Progress Notes (Signed)
[facility] Progress Note  4 Days Post-Op  Subjective: Patient reports more RUQ pain overnight. Denies nausea, +flatus. Breathing feels improved from yesterday. Going down for HIDA scan this AM. Wife at bedside this AM.   Objective: Vital signs in last 24 hours: Temp:  [98 F (36.7 C)] 98 F (36.7 C) (07/01 2121) Pulse Rate:  [79-86] 79 (07/01 2121) BP: (138-141)/(77-85) 138/77 (07/01 2121) SpO2:  [95 %-97 %] 95 % (07/01 2121) Last BM Date: 01/12/20  Intake/Output from previous day: 07/01 0701 - 07/02 0700 In: 2086 [I.V.:1694.2; IV Piggyback:391.8] Out: 2200 [Urine:2050; Drains:150] Intake/Output this shift: No intake/output data recorded.  PE: General: pleasant, WD,obesemale who is laying in bed in NAD HEENT: Sclera are anicteric. PERRL. Ears and nose without any masses or lesions. Mouth is pink and moist Heart: regular, rate, and rhythm. Palpable radial and pedal pulses bilaterally Lungs: normal effort, CTAB Abd: soft,appropriately ttp,distended,umbilical incision c/d/i, RUQ incision with honeycomb present, drain in RUQ with bilious appearing drainage MS: all 4 extremities are symmetrical with no cyanosis, clubbing; trace edema in hands and BLEs Skin: warm and dry with no masses, lesions, or rashes Neuro: Cranial nerves 2-12 grossly intact, sensation grossly intact throughout Psych: A&Ox3 with an appropriate affect.   Lab Results:  Recent Labs    01/17/20 0354 01/18/20 0351  WBC 18.8* 15.2*  HGB 12.4* 12.0*  HCT 37.4* 35.2*  PLT 257 172   BMET Recent Labs    01/17/20 0354 01/18/20 0351  NA 134* 136  K 4.1 3.9  CL 102 101  CO2 26 26  GLUCOSE 119* 102*  BUN 15 12  CREATININE 0.80 0.88  CALCIUM 8.8* 8.7*   PT/INR No results for input(s): LABPROT, INR in the last 72 hours. CMP     Component Value Date/Time   NA 136 01/18/2020 0351   K 3.9 01/18/2020 0351   CL 101 01/18/2020 0351   CO2 26 01/18/2020 0351   GLUCOSE 102 (H)  01/18/2020 0351   BUN 12 01/18/2020 0351   CREATININE 0.88 01/18/2020 0351   CALCIUM 8.7 (L) 01/18/2020 0351   PROT 5.5 (L) 01/18/2020 0351   ALBUMIN 2.4 (L) 01/18/2020 0351   AST 35 01/18/2020 0351   ALT 49 (H) 01/18/2020 0351   ALKPHOS 78 01/18/2020 0351   BILITOT 1.9 (H) 01/18/2020 0351   GFRNONAA >60 01/18/2020 0351   GFRAA >60 01/18/2020 0351   Lipase     Component Value Date/Time   LIPASE 17 01/12/2020 1209       Studies/Results: CT ANGIO NECK W OR WO CONTRAST  Result Date: 01/16/2020 CLINICAL DATA:  Transient loss of vision EXAM: CT ANGIOGRAPHY NECK TECHNIQUE: Multidetector CT imaging of the neck was performed using the standard protocol during bolus administration of intravenous contrast. Multiplanar CT image reconstructions and MIPs were obtained to evaluate the vascular anatomy. Carotid stenosis measurements (when applicable) are obtained utilizing NASCET criteria, using the distal internal carotid diameter as the denominator. CONTRAST:  OMNIPAQUE IOHEXOL 350 MG/ML SOLN COMPARISON:  None. FINDINGS: Aortic arch: Calcified plaque along the aortic arch. Great vessel origins patent. Right carotid system: Patent. No measurable stenosis at the ICA origin. No evidence of dissection. There is partially retropharyngeal course. Left carotid system: Patent. No measurable stenosis at the ICA origin. No evidence of dissection. Partially retropharyngeal course. Vertebral arteries: Extracranial vertebral arteries are patent. Right vertebral artery is dominant. Skeleton: Advanced degenerative changes of the cervical spine. Other neck: No mass or adenopathy. Upper chest: No apical  lung mass. IMPRESSION: No large vessel occlusion, hemodynamically significant stenosis, or evidence of dissection. Electronically Signed   By: Guadlupe Spanish M.D.   On: 01/16/2020 11:51   DG CHEST PORT 1 VIEW  Result Date: 01/18/2020 CLINICAL DATA:  Follow-up pleural effusion. EXAM: PORTABLE CHEST 1 VIEW  COMPARISON:  Chest x-ray 01/17/2020 FINDINGS: The cardiac silhouette, mediastinal and hilar contours are stable. Slightly smaller right pleural effusion. Persistent bibasilar atelectasis. No pulmonary edema or pneumothorax. IMPRESSION: Slightly smaller right pleural effusion. Electronically Signed   By: Rudie Meyer M.D.   On: 01/18/2020 07:00   DG CHEST PORT 1 VIEW  Result Date: 01/17/2020 CLINICAL DATA:  Shortness of breath. EXAM: PORTABLE CHEST 1 VIEW COMPARISON:  None. FINDINGS: Mild cardiomegaly is noted. No pneumothorax is noted. Moderate right pleural effusion is noted with probable underlying atelectasis or infiltrate. Mild left basilar subsegmental atelectasis is noted with probable small pleural effusion. Bony thorax is unremarkable. IMPRESSION: Moderate right pleural effusion is noted with probable underlying atelectasis or infiltrate. Mild left basilar subsegmental atelectasis is noted with probable small pleural effusion. Electronically Signed   By: Lupita Raider M.D.   On: 01/17/2020 10:34   DG Abd Portable 1V  Result Date: 01/17/2020 CLINICAL DATA:  Vomiting and nausea EXAM: PORTABLE ABDOMEN - 1 VIEW COMPARISON:  CT abdomen and pelvis January 12, 2020; cholangiogram January 14, 2020 FINDINGS: There is no appreciable bowel dilatation or air-fluid level to suggest bowel obstruction. No free air. Drain in right upper quadrant noted. Moderate stool in colon. IMPRESSION: No bowel obstruction or free air evident. Postoperative change right upper quadrant with drain in place. Electronically Signed   By: Bretta Bang III M.D.   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 Darnell Level - POD#4 - WBC 15, afebrile, continue to monitor - continue IV abx - Tbili 1.9, drain with bile tinged fluid - ?possible leak from liver bed, cystic stump leak less likely, HIDA today  - pain reasonably well controlled, work on mobilizing  FEN: IV fluids, NPO for HIDA ID: Zosyn 6/26>> DVT: SCDs, lovenox Follow-up: Dr. Gerrit Friends  LOS: 6 days    Juliet Rude , [facility] 01/18/2020, 8:29 AM Please see Amion for pager number during day hours 7:00am-4:30pm

## 2020-01-19 NOTE — Progress Notes (Signed)
Physical Therapy Treatment Patient Details Name: David Nunez MRN: [id number] DOB: 12/23/1945 Today's Date: 01/19/2020    History of Present Illness Pt is 74 y.o. male HTN, PUD, history TKA, CVA (2009, 2012)who presented on 01/12/2020 with abdominal pain and emesis and found to have acute calculous cholecystitis limited bilirubin.  He underwent open cholecystectomy on 01/14/2020. On 06/29 patient complained of vision changes with spotting.  Pt s/p bil carotid doppler (neg for significant stenosis), UE U/S (no evidence DVT or thrombosis in subclavian), CTA neck (results negative), and CTA Head (previously known periophthalmic aneurysm, Stenosis of L MCA present)    PT Comments    Pt making good progress today.  Demonstrating decreased DOE with activity and able to increase gait.  Required min cues for RW use and transfer technique for pain control.  Cont to progress as able.  Recommend frequent ambulation with nursing.     Follow Up Recommendations  No PT follow up;Supervision - Intermittent     Equipment Recommendations  None recommended by PT    Recommendations for Other Services       Precautions / Restrictions Precautions Precautions: None    Mobility  Bed Mobility Overal bed mobility: Needs Assistance Bed Mobility: Sit to Supine       Sit to supine: Min guard   General bed mobility comments: in chair at arrival; min guard and cues for log roll for comfort for sit to supine  Transfers Overall transfer level: Needs assistance Equipment used: None Transfers: Sit to/from Stand Sit to Stand: Min guard         General transfer comment: cues for safe hand placement  Ambulation/Gait Ambulation/Gait assistance: Min guard Gait Distance (Feet): 150 Feet Assistive device: Rolling walker (2 wheeled) Gait Pattern/deviations: Decreased stride length Gait velocity: decreased   General Gait Details: DOE 2/4 with O2 sats 96%; min cues for RW   Stairs              Wheelchair Mobility    Modified Rankin (Stroke Patients Only)       Balance Overall balance assessment: Needs assistance Sitting-balance support: No upper extremity supported Sitting balance-Leahy Scale: Good     Standing balance support: No upper extremity supported Standing balance-Leahy Scale: Good Standing balance comment: Able to stand without RW and adjust gown                            Cognition Arousal/Alertness: Awake/alert Behavior During Therapy: WFL for tasks assessed/performed Overall Cognitive Status: Within Functional Limits for tasks assessed                                        Exercises      General Comments        Pertinent Vitals/Pain Pain Assessment: 0-10 Pain Score: 4  Pain Location: stomach Pain Descriptors / Indicators: Discomfort Pain Intervention(s): Monitored during session;Limited activity within patient's tolerance    Home Living                      Prior Function            PT Goals (current goals can now be found in the care plan section) Acute Rehab PT Goals Patient Stated Goal: return home PT Goal Formulation: With patient/family Time For Goal Achievement: 01/30/20 Potential to Achieve Goals: Good Progress towards  PT goals: Progressing toward goals    Frequency    Min 3X/week      PT Plan Current plan remains appropriate    Co-evaluation              AM-PAC PT "6 Clicks" Mobility   Outcome Measure  Help needed turning from your back to your side while in a flat bed without using bedrails?: A Little Help needed moving from lying on your back to sitting on the side of a flat bed without using bedrails?: A Little Help needed moving to and from a bed to a chair (including a wheelchair)?: None Help needed standing up from a chair using your arms (e.g., wheelchair or bedside chair)?: None Help needed to walk in hospital room?: None Help needed climbing 3-5 steps with  a railing? : A Little 6 Click Score: 21    End of Session Equipment Utilized During Treatment: Gait belt Activity Tolerance: Patient tolerated treatment well Patient left: in bed;with bed alarm set;with call bell/phone within reach Nurse Communication: Mobility status (condom cath fell off - needs new cath) PT Visit Diagnosis: Other abnormalities of gait and mobility (R26.89)     Time: 4696-2952 PT Time Calculation (min) (ACUTE ONLY): 21 min  Charges:  $Gait Training: 8-22 mins                     Anise Salvo, PT Acute Rehab Services Pager [phone] [facility] [phone]     Rayetta Humphrey 01/19/2020, 9:42 AM

## 2020-01-19 NOTE — Plan of Care (Signed)

## 2020-01-20 LAB — BASIC METABOLIC PANEL
Anion gap: 7 (ref 5–15)
BUN: 8 mg/dL (ref 8–23)
CO2: 24 mmol/L (ref 22–32)
Calcium: 8.5 mg/dL — ABNORMAL LOW (ref 8.9–10.3)
Chloride: 105 mmol/L (ref 98–111)
Creatinine, Ser: 0.99 mg/dL (ref 0.61–1.24)
GFR calc Af Amer: >60 mL/min (ref >60)
GFR calc non Af Amer: >60 mL/min (ref >60)
Glucose, Bld: 96 mg/dL (ref 70–99)
Potassium: 3.7 mmol/L (ref 3.5–5.1)
Sodium: 136 mmol/L (ref 135–145)

## 2020-01-20 LAB — CBC
HCT: 32.1 % — ABNORMAL LOW (ref 39.0–52.0)
Hemoglobin: 10.7 g/dL — ABNORMAL LOW (ref 13.0–17.0)
MCH: 27 pg (ref 26.0–34.0)
MCHC: 33.3 g/dL (ref 30.0–36.0)
MCV: 81.1 fL (ref 80.0–100.0)
Platelets: 276 10*3/uL (ref 150–400)
RBC: 3.96 MIL/uL — ABNORMAL LOW (ref 4.22–5.81)
RDW: 15.5 % (ref 11.5–15.5)
WBC: 13.1 10*3/uL — ABNORMAL HIGH (ref 4.0–10.5)
nRBC: 0.4 % — ABNORMAL HIGH (ref 0.0–0.2)

## 2020-01-20 MED ORDER — PANTOPRAZOLE SODIUM 40 MG PO TBEC
40.0000 mg | DELAYED_RELEASE_TABLET | Freq: Every day | ORAL | Status: DC
  Administered 2020-01-20 – 2020-01-21 (×2): 40 mg via ORAL
  Filled 2020-01-20 (×2): qty 1

## 2020-01-20 NOTE — Plan of Care (Signed)

## 2020-01-20 NOTE — Progress Notes (Signed)
[facility] Progress Note  5 Days Post-Op  Subjective: Pt feeling better.  No nausea.  Tolerating clears.  HIDA neg   Objective: Vital signs in last 24 hours: Temp:  [97.7 F (36.5 C)-98.7 F (37.1 C)] 98.7 F (37.1 C) (07/04 0501) Pulse Rate:  [58-73] 58 (07/04 0501) Resp:  [18-24] 20 (07/04 0501) BP: (139-145)/(70-81) 140/81 (07/04 0501) SpO2:  [92 %-100 %] 100 % (07/04 0501) Last BM Date: 01/12/20  Intake/Output from previous day: 07/03 0701 - 07/04 0700 In: 562 [I.V.:312; IV Piggyback:250] Out: 1500 [Urine:1400; Drains:100] Intake/Output this shift: No intake/output data recorded.  PE: General: pleasant, WD,obesemale who is laying in bed in NAD Abd: soft,appropriately ttp,distended,umbilical incision c/d/i, RUQ incision with honeycomb present, drain in RUQ with clear drainage MS: L forearm blistering   Lab Results:  Recent Labs    01/18/20 0351 01/20/20 0536  WBC 15.2* 13.1*  HGB 12.0* 10.7*  HCT 35.2* 32.1*  PLT 172 276   BMET Recent Labs    01/18/20 0351 01/20/20 0536  NA 136 136  K 3.9 3.7  CL 101 105  CO2 26 24  GLUCOSE 102* 96  BUN 12 8  CREATININE 0.88 0.99  CALCIUM 8.7* 8.5*   PT/INR No results for input(s): LABPROT, INR in the last 72 hours. CMP     Component Value Date/Time   NA 136 01/20/2020 0536   K 3.7 01/20/2020 0536   CL 105 01/20/2020 0536   CO2 24 01/20/2020 0536   GLUCOSE 96 01/20/2020 0536   BUN 8 01/20/2020 0536   CREATININE 0.99 01/20/2020 0536   CALCIUM 8.5 (L) 01/20/2020 0536   PROT 5.5 (L) 01/18/2020 0351   ALBUMIN 2.4 (L) 01/18/2020 0351   AST 35 01/18/2020 0351   ALT 49 (H) 01/18/2020 0351   ALKPHOS 78 01/18/2020 0351   BILITOT 1.9 (H) 01/18/2020 0351   GFRNONAA >60 01/20/2020 0536   GFRAA >60 01/20/2020 0536   Lipase     Component Value Date/Time   LIPASE 17 01/12/2020 1209       Studies/Results: NM HEPATO BILIARY LEAK  Result Date: 01/18/2020 CLINICAL DATA:  Concern for biliary  leak.  Recent cholecystectomy. EXAM: NUCLEAR MEDICINE HEPATOBILIARY IMAGING TECHNIQUE: Sequential images of the abdomen were obtained out to 60 minutes following intravenous administration of radiopharmaceutical. RADIOPHARMACEUTICALS:  4.9 mCi Tc-63m  Choletec IV COMPARISON:  CT 01/15/2020 FINDINGS: Prompt radiotracer uptake within liver. Counts are present within the common bile duct and small bowel by 30 minutes. Reflux into the stomach noted. Imaging performed for 1hour and 45 minutes. No evidence extraluminal radiotracer. IMPRESSION: No evidence of bile leak. Electronically Signed   By: Genevive Bi M.D.   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 Darnell Level - POD#5 - WBC coming down, afebrile, continue to monitor - continue IV abx - pain reasonably well controlled, work on mobilizing  FEN: advance to fulls ID: Zosyn 6/26>> DVT: SCDs, lovenox Follow-up: Dr. Gerrit Friends  LOS: 8 days    [facility] 01/20/2020, 7:56 AM Please see Amion for  pager number during day hours 7:00am-4:30pm

## 2020-01-20 NOTE — Progress Notes (Signed)
[facility] Progress Note  6 Days Post-Op  Subjective: Pt feeling better.  No nausea.  Tolerating fulls.  Doing well with PT   Objective: Vital signs in last 24 hours: Temp:  [97.7 F (36.5 C)-98.7 F (37.1 C)] 98.7 F (37.1 C) (07/04 0501) Pulse Rate:  [58-73] 58 (07/04 0501) Resp:  [18-24] 20 (07/04 0501) BP: (139-145)/(70-81) 140/81 (07/04 0501) SpO2:  [92 %-100 %] 100 % (07/04 0501) Last BM Date: 01/12/20  Intake/Output from previous day: 07/03 0701 - 07/04 0700 In: 562 [I.V.:312; IV Piggyback:250] Out: 1500 [Urine:1400; Drains:100] Intake/Output this shift: No intake/output data recorded.  PE: General: pleasant, WD,obesemale who is laying in bed in NAD Abd: soft,appropriately ttp,distended,umbilical incision c/d/i, RUQ incision with honeycomb present, drain in RUQ with clear drainage MS: L forearm blistering   Lab Results:  Recent Labs    01/18/20 0351 01/20/20 0536  WBC 15.2* 13.1*  HGB 12.0* 10.7*  HCT 35.2* 32.1*  PLT 172 276   BMET Recent Labs    01/18/20 0351 01/20/20 0536  NA 136 136  K 3.9 3.7  CL 101 105  CO2 26 24  GLUCOSE 102* 96  BUN 12 8  CREATININE 0.88 0.99  CALCIUM 8.7* 8.5*   PT/INR No results for input(s): LABPROT, INR in the last 72 hours. CMP     Component Value Date/Time   NA 136 01/20/2020 0536   K 3.7 01/20/2020 0536   CL 105 01/20/2020 0536   CO2 24 01/20/2020 0536   GLUCOSE 96 01/20/2020 0536   BUN 8 01/20/2020 0536   CREATININE 0.99 01/20/2020 0536   CALCIUM 8.5 (L) 01/20/2020 0536   PROT 5.5 (L) 01/18/2020 0351   ALBUMIN 2.4 (L) 01/18/2020 0351   AST 35 01/18/2020 0351   ALT 49 (H) 01/18/2020 0351   ALKPHOS 78 01/18/2020 0351   BILITOT 1.9 (H) 01/18/2020 0351   GFRNONAA >60 01/20/2020 0536   GFRAA >60 01/20/2020 0536   Lipase     Component Value Date/Time   LIPASE 17 01/12/2020 1209       Studies/Results: NM HEPATO BILIARY LEAK  Result Date: 01/18/2020 CLINICAL DATA:  Concern for  biliary leak.  Recent cholecystectomy. EXAM: NUCLEAR MEDICINE HEPATOBILIARY IMAGING TECHNIQUE: Sequential images of the abdomen were obtained out to 60 minutes following intravenous administration of radiopharmaceutical. RADIOPHARMACEUTICALS:  4.9 mCi Tc-63m  Choletec IV COMPARISON:  CT 01/15/2020 FINDINGS: Prompt radiotracer uptake within liver. Counts are present within the common bile duct and small bowel by 30 minutes. Reflux into the stomach noted. Imaging performed for 1hour and 45 minutes. No evidence extraluminal radiotracer. IMPRESSION: No evidence of bile leak. Electronically Signed   By: Genevive Bi M.D.   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 Darnell Level - POD#6 - WBC coming down, afebrile, continue to monitor - continue IV abx, stop POD 7 - pain reasonably well controlled, work on mobilizing -SL IVF - PO pain meds FEN: advance to RD ID: Zosyn 6/26>> stop on POD 7 DVT: SCDs, lovenox Follow-up: Dr. Gerrit Friends  LOS: 8 days    [facility] 01/20/2020, 7:59 AM Please see Amion for pager number during day hours 7:00am-4:30pm

## 2020-01-21 LAB — CBC
HCT: 33.5 % — ABNORMAL LOW (ref 39.0–52.0)
Hemoglobin: 11.1 g/dL — ABNORMAL LOW (ref 13.0–17.0)
MCH: 27.1 pg (ref 26.0–34.0)
MCHC: 33.1 g/dL (ref 30.0–36.0)
MCV: 81.7 fL (ref 80.0–100.0)
Platelets: 310 10*3/uL (ref 150–400)
RBC: 4.1 MIL/uL — ABNORMAL LOW (ref 4.22–5.81)
RDW: 15.9 % — ABNORMAL HIGH (ref 11.5–15.5)
WBC: 12.9 10*3/uL — ABNORMAL HIGH (ref 4.0–10.5)
nRBC: 0.2 % (ref 0.0–0.2)

## 2020-01-21 MED ORDER — OXYCODONE HCL 5 MG PO TABS: 5.0000 mg | ORAL_TABLET | Freq: Four times a day (QID) | ORAL | 0 refills | Status: DC | PRN

## 2020-01-21 MED ORDER — DOCUSATE SODIUM 100 MG PO CAPS: 100.0000 mg | ORAL_CAPSULE | Freq: Two times a day (BID) | ORAL | 0 refills | Status: DC

## 2020-01-21 NOTE — Discharge Summary (Signed)
Physician Discharge Summary  Patient ID: David Nunez MRN: [id number] DOB/AGE: 1945/12/30 74 y.o.  Admit date: 01/12/2020 Discharge date: 01/21/2020  Admission Diagnoses: cholecystitis  Discharge Diagnoses:  Principal Problem:   Cholelithiasis with acute cholecystitis Active Problems:   History of peptic ulcer disease   Hypertension   Cholecystitis, acute with cholelithiasis   Vision, loss, sudden   Spots in front of the eye   History of CVA (cerebrovascular accident)   Left arm swelling   Blistering   Discharged Condition: good  Hospital Course: Patient presented to the emergency department with a 3-day history of abdominal pain nausea and vomiting.  In the emergency department he was noted to have a significantly elevated white count and a bilirubin of 3.0.  Cholecystectomy was recommended.  Patient has significant dense adhesions from his previous surgery.  There was a large inflammatory mass at the hepatic flexure of the colon.  The gallbladder was not able to be visualized laparoscopically and therefore the decision was made to convert to open cholecystectomy.  IOC was normal.  Patient was admitted to the floor.  On the night after surgery, both of his IVs infiltrated causing blistering in his forearms.  Given his neurologic symptoms of tingling in his upper extremities, a medicine consult was obtained.  They recommended 81 mg aspirin and follow-up with neurology.  They also recommended a upper extremity ultrasound to rule out DVT due to his blistering.  This was performed as well as a cardiac ultrasound.  Results are noted in epic.  Patient began physical therapy and advancing diet.  His drain was noted to be possibly bilious and therefore a HIDA scan was obtained.  This showed no leak of the biliary system.  Over the next few days his diet was slowly advanced.  He was monitored on telemetry with no signs of acute cardiac events.  By postop day 7 the patient was tolerating a diet  without difficulty and having good bowel function.  His pain was controlled with oral pain medications and he was ambulating without difficulty.  He was cleared for discharge to home by physical therapy with no needs noted.  His JP was removed.  He completed a 10-day course of IV antibiotics.  Consults: Internal medicine  Significant Diagnostic Studies: labs: CBC, CMET and nuclear medicine: HIDA: no biliary leak, upper extremity ultrasound, cardiac ultrasound  Treatments: IV hydration, antibiotics: Zosyn, analgesia: Dilaudid and surgery: Open cholecystectomy  Discharge Exam: Blood pressure 125/73, pulse 69, temperature 98.3 F (36.8 C), temperature source Oral, resp. rate 20, height 5\' 8"  (1.727 m), weight 123.8 kg, SpO2 94 %. General appearance: alert and cooperative GI: soft, non-tender Incision/Wound:  Disposition: home   Allergies as of 01/21/2020   No Known Allergies     Medication List    STOP taking these medications   ciprofloxacin 500 MG tablet Commonly known as: CIPRO   traMADol 50 MG tablet Commonly known as: ULTRAM     TAKE these medications   amLODipine 10 MG tablet Commonly known as: NORVASC Take 10 mg by mouth daily.   cholecalciferol 25 MCG (1000 UNIT) tablet Commonly known as: VITAMIN D3 Take 1,000 Units by mouth daily.   docusate sodium 100 MG capsule Commonly known as: COLACE Take 1 capsule (100 mg total) by mouth 2 (two) times daily.   ferrous sulfate 325 (65 FE) MG tablet Take 325 mg by mouth as directed. MWF   naproxen sodium 220 MG tablet Commonly known as: ALEVE Take 220 mg by  mouth daily as needed (pain).   omeprazole 20 MG capsule Commonly known as: PRILOSEC Take 20 mg by mouth daily.   oxyCODONE 5 MG immediate release tablet Commonly known as: Oxy IR/ROXICODONE Take 1-2 tablets (5-10 mg total) by mouth every 6 (six) hours as needed for moderate pain.   vitamin B-12 500 MCG tablet Commonly known as: CYANOCOBALAMIN Take 500 mcg by  mouth daily.       Follow-up Information    Darnell Level, MD Follow up.   Specialty: General Surgery Contact information: [address] [place] [address] [phone]        [facility] .   Specialty: Neurology Contact information: [address] [id number] mc [place] [address] [phone]              Signed: Vanita Panda 01/21/2020, 8:01 AM

## 2020-01-21 NOTE — Discharge Instructions (Signed)
[facility]      [facility] [phone]  OPEN ABDOMINAL SURGERY: POST OP INSTRUCTIONS  Always review your discharge instruction sheet given to you by the facility where your surgery was performed.  IF YOU HAVE DISABILITY OR FAMILY LEAVE FORMS, YOU MUST BRING THEM TO THE OFFICE FOR PROCESSING.  PLEASE DO NOT GIVE THEM TO YOUR DOCTOR.  1. A prescription for pain medication may be given to you upon discharge.  Take your pain medication as prescribed, if needed.  If narcotic pain medicine is not needed, then you may take acetaminophen (Tylenol) or ibuprofen (Advil) as needed. 2. Take your usually prescribed medications unless otherwise directed. 3. If you need a refill on your pain medication, please contact your pharmacy. They will contact our office to request authorization.  Prescriptions will not be filled after 5pm or on week-ends. 4. You should follow a light diet the first few days after arrival home, such as soup and crackers, pudding, etc.unless your doctor has advised otherwise. A high-fiber, low fat diet can be resumed as tolerated.   Be sure to include lots of fluids daily. Most patients will experience some swelling and bruising on the chest and neck area.  Ice packs will help.  Swelling and bruising can take several days to resolve 5. Most patients will experience some swelling and bruising in the area of the incision. Ice pack will help. Swelling and bruising can take several days to resolve..  6. It is common to experience some constipation if taking pain medication after surgery.  Increasing fluid intake and taking a stool softener will usually help or prevent this problem from occurring.  A mild laxative (Milk of Magnesia or Miralax) should be taken according to package directions if there are no bowel movements after 48 hours. 7.  You may have steri-strips (small skin tapes) in place directly over the incision.  These strips should be left on the skin for 7-10 days.  If your  surgeon used skin glue on the incision, you may shower in 24 hours.  The glue will flake off over the next 2-3 weeks.  Any sutures or staples will be removed at the office during your follow-up visit. You may find that a light gauze bandage over your incision may keep your staples from being rubbed or pulled. You may shower and replace the bandage daily. 8. ACTIVITIES:  You may resume regular (light) daily activities beginning the next day--such as daily self-care, walking, climbing stairs--gradually increasing activities as tolerated.  You may have sexual intercourse when it is comfortable.  Refrain from any heavy lifting or straining until approved by your doctor. a. You may drive when you no longer are taking prescription pain medication, you can comfortably wear a seatbelt, and you can safely maneuver your car and apply brakes  9. You should see your doctor in the office for a follow-up appointment approximately two weeks after your surgery.  Make sure that you call for this appointment within a day or two after you arrive home to insure a convenient appointment time.  WHEN TO CALL YOUR DOCTOR: 1. Fever over 101.0 2. Inability to urinate 3. Nausea and/or vomiting 4. Extreme swelling or bruising 5. Continued bleeding from incision. 6. Increased pain, redness, or drainage from the incision. 7. Difficulty swallowing or breathing 8. Muscle cramping or spasms. 9. Numbness or tingling in hands or feet or around lips.  The clinic staff is available to answer your questions during regular business hours.  Please   don't hesitate to call and ask to speak to one of the nurses if you have concerns.  For further questions, please visit [email or web address]   

## 2020-09-24 ENCOUNTER — Other Ambulatory Visit (INDEPENDENT_AMBULATORY_CARE_PROVIDER_SITE_OTHER): Payer: No Typology Code available for payment source

## 2020-09-24 ENCOUNTER — Other Ambulatory Visit: Payer: Self-pay

## 2020-09-24 ENCOUNTER — Ambulatory Visit (INDEPENDENT_AMBULATORY_CARE_PROVIDER_SITE_OTHER): Payer: No Typology Code available for payment source | Admitting: Gastroenterology

## 2020-09-24 ENCOUNTER — Encounter: Payer: Self-pay | Admitting: Gastroenterology

## 2020-09-24 VITALS — BP 120/80 | HR 71 | Ht 68.0 in | Wt 279.0 lb

## 2020-09-24 DIAGNOSIS — R9389 Abnormal findings on diagnostic imaging of other specified body structures: Secondary | ICD-10-CM | POA: Diagnosis not present

## 2020-09-24 DIAGNOSIS — R945 Abnormal results of liver function studies: Secondary | ICD-10-CM | POA: Diagnosis not present

## 2020-09-24 DIAGNOSIS — R748 Abnormal levels of other serum enzymes: Secondary | ICD-10-CM | POA: Diagnosis not present

## 2020-09-24 DIAGNOSIS — Z9889 Other specified postprocedural states: Secondary | ICD-10-CM

## 2020-09-24 DIAGNOSIS — Z862 Personal history of diseases of the blood and blood-forming organs and certain disorders involving the immune mechanism: Secondary | ICD-10-CM

## 2020-09-24 DIAGNOSIS — Z9049 Acquired absence of other specified parts of digestive tract: Secondary | ICD-10-CM | POA: Diagnosis not present

## 2020-09-24 DIAGNOSIS — R7989 Other specified abnormal findings of blood chemistry: Secondary | ICD-10-CM | POA: Insufficient documentation

## 2020-09-24 LAB — COMPREHENSIVE METABOLIC PANEL
ALT: 15 U/L (ref 0–53)
AST: 12 U/L (ref 0–37)
Albumin: 3.8 g/dL (ref 3.5–5.2)
Alkaline Phosphatase: 102 U/L (ref 39–117)
BUN: 12 mg/dL (ref 6–23)
CO2: 27 mEq/L (ref 19–32)
Calcium: 10 mg/dL (ref 8.4–10.5)
Chloride: 104 mEq/L (ref 96–112)
Creatinine, Ser: 0.93 mg/dL (ref 0.40–1.50)
GFR: 81.78 mL/min (ref >60.00)
Glucose, Bld: 90 mg/dL (ref 70–99)
Potassium: 3.9 mEq/L (ref 3.5–5.1)
Sodium: 138 mEq/L (ref 135–145)
Total Bilirubin: 1.3 mg/dL — ABNORMAL HIGH (ref 0.2–1.2)
Total Protein: 6.8 g/dL (ref 6.0–8.3)

## 2020-09-24 LAB — CORTISOL: Cortisol, Plasma: 8.3 ug/dL

## 2020-09-24 LAB — IBC + FERRITIN
Ferritin: 30.2 ng/mL (ref 22.0–322.0)
Iron: 81 ug/dL (ref 42–165)
Saturation Ratios: 22 % (ref 20.0–50.0)
Transferrin: 263 mg/dL (ref 212.0–360.0)

## 2020-09-24 LAB — TSH: TSH: 2.29 u[IU]/mL (ref 0.35–4.50)

## 2020-09-24 LAB — CK: Total CK: 45 U/L (ref 7–232)

## 2020-09-24 LAB — GAMMA GT: GGT: 84 U/L — ABNORMAL HIGH (ref 7–51)

## 2020-09-24 NOTE — Patient Instructions (Addendum)
If you are age 73 or older, your body mass index should be between 23-30. Your Body mass index is 42.42 kg/m. If this is out of the aforementioned range listed, please consider follow up with your Primary Care Provider.  You have been scheduled for an abdominal ultrasound at [facility] (1st floor of hospital) on 10/02/20 at 9:00am. Please arrive 15 minutes prior to your appointment for registration. Make certain not to have anything to eat or drink 6 hours prior to your appointment. Should you need to reschedule your appointment, please contact radiology at [phone]. This test typically takes about 30 minutes to perform.  Your provider has requested that you go to the basement level for lab work before leaving today. Press "B" on the elevator. The lab is located at the first door on the left as you exit the elevator.  Due to recent changes in healthcare laws, you may see the results of your imaging and laboratory studies on MyChart before your provider has had a chance to review them.  We understand that in some cases there may be results that are confusing or concerning to you. Not all laboratory results come back in the same time frame and the provider may be waiting for multiple results in order to interpret others.  Please give [place] 48 hours in order for your provider to thoroughly review all the results before contacting the office for clarification of your results.    Please keep follow-up appt on: 11/07/20 @ 9:10am   Thank you for choosing me and [facility].  Dr. Meridee Score

## 2020-09-24 NOTE — Progress Notes (Signed)
[place] VISIT   Primary Care Provider [place], [place] [address] [place] [address] [phone]  Referring Provider [facility], [place] [address] [place],   [address] [phone]  Patient Profile: David Nunez is a 75 y.o. 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  The patient presents to the [facility] for an evaluation and management of problem(s) noted below:  Problem List 1. Abnormal LFTs   2. Elevated serum GGT level   3. History of cholecystectomy   4. History of anemia   5. History of gastric surgery     History of Present Illness This is the patient's first visit to the outpatient [place] clinic.  The patient was evaluated by his primary care provider in February 2022 at the [facility] and was found to have abnormal LFTs on his laboratory work-up.  The patient was asymptomatic.  Patient was admitted in June 2021 with severe abdominal pain and was found to have symptomatic cholelithiasis and concern for cholecystitis and underwent a cholecystectomy.  IOC was negative although showed some mild variant biliary anatomy.  HIDA scan performed and was negative during the hospitalization and he was eventually discharged.  Since his surgery he has done very well with no recurrence of his abdominal pain.  The patient denies any issues with jaundice, scleral icterus, generalized pruritus, darkened/amber urine, clay-colored stools, LE edema, hematemesis, coffee-ground emesis, abdominal distention, confusion.  He has bowel movements on a daily basis.  No blood in his stools have been noted (melena/hematochezia/maroon).  The patient does not drink significant alcohol currently or in the past.  No one in his family has a history of liver disease  that we are aware of.  Other than the time in which his gallbladder needed to be removed he has never been told he has any liver related issues.  The patient has well-controlled pyrosis if he takes his omeprazole.  He denies overt dysphagia.  He has had a prior upper and lower endoscopy within the last 4 to 6 years done at the [place] system.  GI Review of Systems Positive as above Negative for odynophagia, nausea, vomiting, nocturnal cough, jaundice, change in bowel habits  Review of Systems General: Denies fevers/chills/weight loss unintentionally HEENT: Denies oral lesions/sore throat/headaches/visual changes Cardiovascular: Denies chest pain/palpitations Pulmonary: Denies shortness of breath/cough Gastroenterological: See HPI Genitourinary: Denies darkened urine or hematuria Hematological: Denies easy bruising/bleeding Endocrine: Denies temperature intolerance Dermatological: Denies jaundice Psychological: Mood is stable   Medications Current Outpatient Medications  Medication Sig Dispense Refill  . amLODipine (NORVASC) 10 MG tablet Take 10 mg by mouth daily.    . cholecalciferol (VITAMIN D3) 25 MCG (1000 UNIT) tablet Take 1,000 Units by mouth daily.    . cyanocobalamin 500 MCG tablet Take 500 mcg by mouth daily.    . ferrous sulfate 325 (65 FE) MG tablet Take 325 mg by mouth as directed. MWF    . hydrochlorothiazide (HYDRODIURIL) 25 MG tablet TAKE ONE TABLET BY MOUTH DAILY **REDUCED DOSE**    . moxifloxacin (VIGAMOX) 0.5 % ophthalmic solution INSTILL 1 DROP IN RIGHT EYE FOUR TIMES A DAY    . naproxen sodium (ALEVE) 220 MG tablet Take 220 mg by mouth daily as needed (pain).    Marland Kitchen omeprazole (PRILOSEC) 20 MG capsule Take 20 mg by mouth daily.    . traMADol (ULTRAM) 50 MG  tablet Take 1 tablet by mouth daily as needed.     No current facility-administered medications for this visit.    Allergies No Known Allergies  Histories Past Medical History:  Diagnosis Date  . Anemia   . BPH  (benign prostatic hyperplasia)   . Cataracts, bilateral   . Hypertension    Past Surgical History:  Procedure Laterality Date  . cataract surgery    . CHOLECYSTECTOMY N/A 01/14/2020   Procedure: DIAGNOSTIC LAPAROSCOPY WITH OPEN CHOLECYSTECTOMY AND INTRAOPERATIVE CHOLANGIOGRAM;  Surgeon: Armandina Gemma, MD;  Location: [facility];  Service: General;  Laterality: N/A;  . JOINT REPLACEMENT     let knee replacement   Social History   Socioeconomic History  . Marital status: Married    Spouse name: Not on file  . Number of children: Not on file  . Years of education: Not on file  . Highest education level: Not on file  Occupational History  . Not on file  Tobacco Use  . Smoking status: Never Smoker  . Smokeless tobacco: Never Used  Substance and Sexual Activity  . Alcohol use: No  . Drug use: No  . Sexual activity: Not on file  Other Topics Concern  . Not on file  Social History Narrative  . Not on file   Social Determinants of Health   Financial Resource Strain: Not on file  Food Insecurity: Not on file  Transportation Needs: Not on file  Physical Activity: Not on file  Stress: Not on file  Social Connections: Not on file  Intimate Partner Violence: Not on file   Family History  Problem Relation Age of Onset  . Cancer Mother        ?  . Colon cancer Neg Hx   . Esophageal cancer Neg Hx   . Inflammatory bowel disease Neg Hx   . Liver disease Neg Hx   . Pancreatic cancer Neg Hx   . Rectal cancer Neg Hx   . Stomach cancer Neg Hx    I have reviewed his medical, social, and family history in detail and updated the electronic medical record as necessary.    PHYSICAL EXAMINATION  BP 120/80   Pulse 71   Ht _0  (1.727 m)   Wt 279 lb (126.6 kg)   BMI 42.42 kg/m  Wt Readings from Last 3 Encounters:  09/24/20 279 lb (126.6 kg)  01/12/20 273 lb (123.8 kg)  GEN: NAD, appears stated age, doesn't appear chronically ill PSYCH: Cooperative, without pressured speech EYE:  Conjunctivae pink, sclerae anicteric ENT: MMM CV: Nontachycardic RESP: No audible wheezing GI: NABS, soft, protuberant abdomen, rounded, obese, NT/ND, without rebound or guarding, unable to appreciate hepatosplenomegaly due to body habitus MSK/EXT: Bilateral lower extremity edema 1+ (per patient this is stable and unchanged)  SKIN: No jaundice, no spider angiomata NEURO:  Alert & Oriented x 3, no focal deficits, no evidence of asterixis   REVIEW OF DATA  I reviewed the following data at the time of this encounter:  GI Procedures and Studies  Patient reports EGD/colonoscopy being completed in the [place] system within the last 4 to 6 years  Laboratory Studies  Reviewed those in epic and in [place] notes  September 09, 2020 labs AST/ALT 110/180 Alkaline phosphatase 166 Total bili 1.6 Direct bili was 0.5 GGT 232 WBC 7.2 Hemoglobin/hematocrit 14.5/43 Platelet 198 MCV 81.1 Alkaline phosphatase isoenzyme liver 70% Alkaline phosphatase isoenzyme bone 30% HCV antibody negative Sodium 137 Potassium 3.5 BUN/creatinine 10/0.9  Imaging Studies  June 2021  CT abdomen pelvis with contrast IMPRESSION: 1. Cholelithiasis with a probable small gallstone lodged in the gallbladder neck with associated interval acute cholecystitis. 2. Mild intrahepatic biliary ductal dilatation, most likely due to obstruction by the changes of acute cholecystitis with dilatation of the gallbladder. 3. Stable tiny, nonobstructing right renal calculus. 4. Moderately enlarged prostate gland. 5. Small to moderate-sized left inguinal hernia containing fat. 6.  Calcific coronary artery and aortic atherosclerosis.  June 2021 IOC FINDINGS: Several saved cine clips are submitted for review. The images demonstrate cannulation of the cystic duct remnant and opacification of the biliary tree. Atypical biliary ductal anatomy. A portion of the left biliary tree appears to drain directly into the common bile duct adjacent to  the cystic duct origin. There is mild intra and extrahepatic biliary ductal dilatation but no evidence of biliary stenosis, stricture or distal choledocholithiasis. There are a few mobile filling defects within the left hepatic ducts. IMPRESSION: 1. Mobile filling defects within the left intrahepatic ducts may represent air bubbles introduced during the intraoperative cholangiogram versus small sludge balls, or less likely choledocholithiasis. No evidence of stones in the common bile duct. 2. Variant biliary ductal anatomy noted incidentally.  November 2021 HIDA IMPRESSION: No evidence of bile leak.   ASSESSMENT  Mr. Longsworth is a 75 y.o. 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  The patient is seen today for evaluation and management of:  1. Abnormal LFTs   2. Elevated serum GGT level   3. History of cholecystectomy   4. History of anemia   5. History of gastric surgery    The patient is hemodynamically and clinically stable.  The patient's etiology of abnormal LFTs is unclear.  We have the abnormalities being noted in his February labs and we need to see how things look this point.  We will plan an extended work-up depending on how his labs look today.  He needs at least an right upper quadrant ultrasound to further evaluate his hepatic echotexture as well as biliary tree.  He is at risk of choledocholithiasis although at [facility] there was no evidence of any abnormality being noted at that time so seems less likely to have choledocholithiasis since he is also not having any overt abdominal pain.  Patient understands that next steps in work-up will be further laboratories as well as potential additional imaging including MRI/MRCP if there is some concern regards to ruling out choledocholithiasis.  If patient's LFT  abnormalities remain elevated for a total of 6 months without progression then he would likely will need a liver biopsy.  Fatty liver disease is a possibility for his underlying liver abnormalities but why he would have that occur rapidly in onset is not completely clear so we will try to exclude other things in the course of the coming weeks/months.  If patient develops severe abdominal pain similar to his cholecystitis then he will alert [place] because he will need additional imaging and labs at that time.  We will work on trying to obtain his upper and lower endoscopies performed in the [place] system over the course the last 4 to 6 years and ensure that he is up-to-date from a colon cancer screening perspective.  I also want to better understand his underlying anemia and so we will get some anemia labs today as well.  He has history of likely a Billroth surgery for perforated  ulcer can lead to some patients developing some B12 or iron deficiency so we will see how his anemia labs look.  All patient questions were answered to the best of my ability, and the patient agrees to the aforementioned plan of action with follow-up as indicated.   PLAN  Laboratories as outlined below Abdominal ultrasound to be scheduled Based on patient's laboratories will determine need for liver biopsy Based on patient's anemia laboratories will determine need for vitamin/micronutrient supplementation We will try to obtain the prior EGD/colonoscopy results done at the [facility] Patient to continue his current PPI dosing once daily   Orders Placed This Encounter  Procedures  . US Abdomen Complete  . Comp Met (CMET)  . TSH  . Cortisol  . IBC + Ferritin  . Tissue transglutaminase, IgA  . ANA  . Mitochondrial antibodies  . Anti-smooth muscle antibody, IgG  . Immunoglobulins, QN, A/E/G/M  . Hepatitis A antibody, total  . Hepatitis B Surface AntiBODY  . Hepatitis B surface antigen  . Hepatitis B Core Antibody, total  . CK  (Creatine Kinase)  . Gamma GT  . IgA    New Prescriptions   No medications on file   Modified Medications   No medications on file    Planned Follow Up No follow-ups on file.   Total Time in Face-to-Face and in Coordination of Care for patient including independent/personal interpretation/review of prior testing, medical history, examination, medication adjustment, communicating results with the patient directly, and documentation with the EHR is 45 minutes.   Justice Britain, MD [facility]oscopy Office # [phone]

## 2020-09-27 LAB — MITOCHONDRIAL ANTIBODIES: Mitochondrial M2 Ab, IgG: <=20 U

## 2020-09-27 LAB — HEPATITIS B SURFACE ANTIBODY,QUALITATIVE: Hep B S Ab: NONREACTIVE

## 2020-09-27 LAB — IMMUNOGLOBULINS A/E/G/M, SERUM
IgA/Immunoglobulin A, Serum: 139 mg/dL (ref 61–437)
IgE (Immunoglobulin E), Serum: 36 IU/mL (ref 6–495)
IgG (Immunoglobin G), Serum: 1095 mg/dL (ref 603–1613)
IgM (Immunoglobulin M), Srm: 114 mg/dL (ref 15–143)

## 2020-09-27 LAB — ANTI-SMOOTH MUSCLE ANTIBODY, IGG: Actin (Smooth Muscle) Antibody (IGG): <20 U (ref <20)

## 2020-09-27 LAB — HEPATITIS B SURFACE ANTIGEN: Hepatitis B Surface Ag: NONREACTIVE

## 2020-09-27 LAB — IGA: Immunoglobulin A: 131 mg/dL (ref 70–320)

## 2020-09-27 LAB — ANA: Anti Nuclear Antibody (ANA): NEGATIVE

## 2020-09-27 LAB — HEPATITIS B CORE ANTIBODY, TOTAL: Hep B Core Total Ab: NONREACTIVE

## 2020-09-27 LAB — HEPATITIS A ANTIBODY, TOTAL: Hepatitis A AB,Total: REACTIVE — AB

## 2020-09-27 LAB — TISSUE TRANSGLUTAMINASE, IGA: (tTG) Ab, IgA: <1 U/mL

## 2020-09-30 ENCOUNTER — Other Ambulatory Visit: Payer: Self-pay

## 2020-09-30 DIAGNOSIS — R7989 Other specified abnormal findings of blood chemistry: Secondary | ICD-10-CM

## 2020-09-30 DIAGNOSIS — R945 Abnormal results of liver function studies: Secondary | ICD-10-CM

## 2020-10-02 ENCOUNTER — Ambulatory Visit (HOSPITAL_COMMUNITY)
Admission: RE | Admit: 2020-10-02 | Discharge: 2020-10-02 | Disposition: A | Discharge status: Home / Self Care | Payer: No Typology Code available for payment source | Source: Ambulatory Visit | Attending: Gastroenterology | Admitting: Gastroenterology

## 2020-10-02 ENCOUNTER — Other Ambulatory Visit: Payer: Self-pay

## 2020-10-02 DIAGNOSIS — R945 Abnormal results of liver function studies: Secondary | ICD-10-CM | POA: Insufficient documentation

## 2020-10-02 DIAGNOSIS — R7989 Other specified abnormal findings of blood chemistry: Secondary | ICD-10-CM

## 2020-11-02 NOTE — Addendum Note (Signed)
Encounter addended by: Novella Olive on: 11/02/2020 5:06 PM  Actions taken: Letter saved

## 2020-11-07 ENCOUNTER — Other Ambulatory Visit (INDEPENDENT_AMBULATORY_CARE_PROVIDER_SITE_OTHER): Payer: No Typology Code available for payment source

## 2020-11-07 ENCOUNTER — Other Ambulatory Visit: Payer: Self-pay | Admitting: Gastroenterology

## 2020-11-07 ENCOUNTER — Ambulatory Visit (INDEPENDENT_AMBULATORY_CARE_PROVIDER_SITE_OTHER): Payer: No Typology Code available for payment source | Admitting: Gastroenterology

## 2020-11-07 ENCOUNTER — Encounter: Payer: Self-pay | Admitting: Gastroenterology

## 2020-11-07 VITALS — BP 124/70 | HR 84 | Ht 68.0 in | Wt 275.8 lb

## 2020-11-07 DIAGNOSIS — R945 Abnormal results of liver function studies: Secondary | ICD-10-CM

## 2020-11-07 DIAGNOSIS — R932 Abnormal findings on diagnostic imaging of liver and biliary tract: Secondary | ICD-10-CM

## 2020-11-07 DIAGNOSIS — R7989 Other specified abnormal findings of blood chemistry: Secondary | ICD-10-CM

## 2020-11-07 DIAGNOSIS — R748 Abnormal levels of other serum enzymes: Secondary | ICD-10-CM

## 2020-11-07 DIAGNOSIS — R17 Unspecified jaundice: Secondary | ICD-10-CM

## 2020-11-07 DIAGNOSIS — K838 Other specified diseases of biliary tract: Secondary | ICD-10-CM

## 2020-11-07 DIAGNOSIS — K760 Fatty (change of) liver, not elsewhere classified: Secondary | ICD-10-CM | POA: Diagnosis not present

## 2020-11-07 LAB — HEPATIC FUNCTION PANEL
ALT: 20 U/L (ref 0–53)
AST: 16 U/L (ref 0–37)
Albumin: 3.8 g/dL (ref 3.5–5.2)
Alkaline Phosphatase: 92 U/L (ref 39–117)
Bilirubin, Direct: 0.4 mg/dL — ABNORMAL HIGH (ref 0.0–0.3)
Total Bilirubin: 1.6 mg/dL — ABNORMAL HIGH (ref 0.2–1.2)
Total Protein: 6.8 g/dL (ref 6.0–8.3)

## 2020-11-07 LAB — GAMMA GT: GGT: 66 U/L — ABNORMAL HIGH (ref 7–51)

## 2020-11-07 LAB — BILIRUBIN, DIRECT: Bilirubin, Direct: 0.4 mg/dL — ABNORMAL HIGH (ref 0.0–0.3)

## 2020-11-07 NOTE — Progress Notes (Signed)
GASTROENTEROLOGY OUTPATIENT CLINIC VISIT   Primary Care Provider [place] [address] [place] [address] [phone]  Patient Profile: David Nunez is a 75 y.o. 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  The patient presents to the [facility] for an evaluation and management of problem(s) noted below:  Problem List 1. Total bilirubin, elevated   2. Abnormal LFTs   3. Fatty liver   4. Unconjugated benign bilirubinemia   5. Abnormal ultrasound of liver   6. Dilated bile duct   7. Morbid obesity (HCC)     History of Present Illness Please see initial consultation note for full details of HPI.  Interval History The patient is accompanied by his wife once again today.  He comes in for follow-up.  At last visit we obtain a significant progressive liver biochemical testing work-up.  His transferases and alkaline phosphatase were completely normal.  Bilirubin was obtained and suggested an indirect bilirubinemia.  Patient has not had any major significant abdominal pain or discomfort.  Abdominal imaging showed evidence of concern for fatty liver based on the echotexture.  The patient has returned to the gym and is starting to workout a few times per week.  Diet as per wife notation is not perfect but hopeful that things can transition to a better/healthier meal overall.  The patient denies any issues with jaundice, scleral icterus, generalized pruritus, darkened/amber urine, clay-colored stools, LE edema, hematemesis, coffee-ground emesis, abdominal distention, confusion.  We were unable to obtain the previous EGD/colonoscopy records from the [place] but will try again.  No other significant changes other than the patient wanting to be as healthy as possible.  Interval  History Today, the patient returns for scheduled follow-up  GI Review of Systems Positive as above Negative for dysphagia, odynophagia, nausea, vomiting, pain, change in bowel habits   Review of Systems General: Denies fevers/chills/weight loss unintentionally Cardiovascular: Denies chest pain Pulmonary: Denies shortness of breath/nocturnal cough Gastroenterological: See HPI Genitourinary: Denies darkened urine Hematological: Denies easy bruising/bleeding Dermatological: Denies jaundice Psychological: Mood is stable   Medications Current Outpatient Medications  Medication Sig Dispense Refill  . amLODipine (NORVASC) 10 MG tablet Take 10 mg by mouth daily.    . cholecalciferol (VITAMIN D3) 25 MCG (1000 UNIT) tablet Take 1,000 Units by mouth daily.    . cyanocobalamin 500 MCG tablet Take 500 mcg by mouth daily.    . ferrous sulfate 325 (65 FE) MG tablet Take 325 mg by mouth as directed. MWF    . hydrochlorothiazide (HYDRODIURIL) 25 MG tablet TAKE ONE TABLET BY MOUTH DAILY **REDUCED DOSE**    . moxifloxacin (VIGAMOX) 0.5 % ophthalmic solution INSTILL 1 DROP IN RIGHT EYE FOUR TIMES A DAY    . naproxen sodium (ALEVE) 220 MG tablet Take 220 mg by mouth daily as needed (pain).    Marland Kitchen omeprazole (PRILOSEC) 20 MG capsule Take 20 mg by mouth daily.    . traMADol (ULTRAM) 50 MG tablet Take 1 tablet by mouth daily as needed.     No current facility-administered medications for this visit.    Allergies No Known Allergies  Histories Past Medical History:  Diagnosis Date  . Anemia   . BPH (benign prostatic hyperplasia)   . Cataracts, bilateral   . Elevated LFTs   . Hypertension    Past Surgical History:  Procedure Laterality Date  .  cataract surgery Right   . CHOLECYSTECTOMY N/A 01/14/2020   Procedure: DIAGNOSTIC LAPAROSCOPY WITH OPEN CHOLECYSTECTOMY AND INTRAOPERATIVE CHOLANGIOGRAM;  Surgeon: Darnell Level, MD;  Location: [facility];  Service: General;  Laterality: N/A;  . gastric ulcer  surgery    . REPLACEMENT TOTAL KNEE Bilateral    Social History   Socioeconomic History  . Marital status: Married    Spouse name: Not on file  . Number of children: Not on file  . Years of education: Not on file  . Highest education level: Not on file  Occupational History  . Not on file  Tobacco Use  . Smoking status: Never Smoker  . Smokeless tobacco: Never Used  Substance and Sexual Activity  . Alcohol use: No  . Drug use: No  . Sexual activity: Not on file  Other Topics Concern  . Not on file  Social History Narrative  . Not on file   Social Determinants of Health   Financial Resource Strain: Not on file  Food Insecurity: Not on file  Transportation Needs: Not on file  Physical Activity: Not on file  Stress: Not on file  Social Connections: Not on file  Intimate Partner Violence: Not on file   Family History  Problem Relation Age of Onset  . Cancer Mother        ?  . Colon cancer Neg Hx   . Esophageal cancer Neg Hx   . Inflammatory bowel disease Neg Hx   . Liver disease Neg Hx   . Pancreatic cancer Neg Hx   . Rectal cancer Neg Hx   . Stomach cancer Neg Hx    I have reviewed his medical, social, and family history in detail and updated the electronic medical record as necessary.    PHYSICAL EXAMINATION  BP 124/70   Pulse 84   Ht 5\' 8"  (1.727 m)   Wt 275 lb 12.8 oz (125.1 kg)   SpO2 98%   BMI 41.94 kg/m  Wt Readings from Last 3 Encounters:  11/07/20 275 lb 12.8 oz (125.1 kg)  09/24/20 279 lb (126.6 kg)  01/12/20 273 lb (123.8 kg)  GEN: NAD, appears stated age, doesn't appear chronically ill, accompanied by wife PSYCH: Cooperative, without pressured speech EYE: Conjunctivae pink, sclerae anicteric ENT: Masked CV: Nontachycardic RESP: No audible wheezing GI: NABS, soft, protuberant abdomen, rounded, obese, NT/ND, without rebound or guarding, unable to appreciate hepatosplenomegaly due to body habitus MSK/EXT: Bilateral lower extremity edema  present SKIN: No jaundice, no spider angiomata NEURO:  Alert & Oriented x 3, no focal deficits, no evidence of asterixis   REVIEW OF DATA  I reviewed the following data at the time of this encounter:  GI Procedures and Studies  Still awaiting EGD/colonoscopy reports from the 01/14/20 over the last 4 to 6 years and we will try to reobtain those today  Laboratory Studies  Reviewed those in epic  Imaging Studies  March 2022 abdominal ultrasound IMPRESSION: 1. Markedly limited evaluation with nonvisualization of the inferior vena cava and pancreas. 2. Hepatic steatosis. Please note limited evaluation for focal hepatic masses in a patient with hepatic steatosis due to decreased penetration of the acoustic ultrasound waves. Recommend MRI liver protocol for further evaluation. 3. Enlarged common bile duct measuring up to 10 mm. Finding can be normal in the setting of post cholecystectomy as in this patient. If clinically indicated, consider MRCP for further evaluation.   ASSESSMENT  Mr. Pocock is a 75 y.o. male with a pmh significant for hypertension,  obesity, BPH, cataracts, nephrolithiasis, osteoarthritis (status post TKR), status post cholecystectomy, status post PUD surgery (likely Billroth for perforated ulcer), GERD.  The patient is seen today for evaluation and management of:  1. Total bilirubin, elevated   2. Abnormal LFTs   3. Fatty liver   4. Unconjugated benign bilirubinemia   5. Abnormal ultrasound of liver   6. Dilated bile duct   7. Morbid obesity (HCC)    The patient is hemodynamically and clinically stable.  Patient's most recent LFTs were significantly improved and nearly normal.  He just had an unconjugated bilirubinemia.  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  With that being said his weight and comorbidities increases risk for metabolic associated fatty liver disease and the findings on his liver ultrasound were suggestive of this.  The bile duct was enlarged which be expected in  the setting of a post cholecystectomy status.  If patient's liver biochemical testing continues to be elevated (in particular if alkaline phosphatase rises) we may consider MRI/MRCP.  Patient and wife feel that they will be able to work on his weight over the course of the coming months.  I have asked him to consider a Mediterranean diet.  He is already working out at [employer] and will continue that and increase things slowly.  Hopefully over the course of the coming weeks and months we will see continued improvement and stabilization of his liver biochemical testing.  We will hold on pursuing a liver biopsy currently.  We are awaiting the upper and lower endoscopies performed at the [facility] within the last 4 to 6 years and have asked for these to be sent to [place] once again.  If the patient is due for colon cancer screening we will move forward with getting that scheduled here.  Follow-up in 3 to 4 months with repeat LFTs at that time.  We will recheck his CPK level since it was slightly elevated as well as his GGT. All patient questions were answered to the best of my ability, and the patient agrees to the aforementioned plan of action with follow-up as indicated.   PLAN  Hepatic function panel, direct bilirubin, GGT to be obtained Based on patient's laboratories will determine need for liver biopsy though suspect his unconjugated bilirubinemia is likely Gilbert's syndrome Still trying to obtain prior EGD/colonoscopy results done at the [place] to see if due for colon cancer screening Patient to continue his current PPI dosing once daily Try Mediterranean diet 10% weight loss goal in next 4 to 6 months   Orders Placed This Encounter  Procedures  . Gamma GT    New Prescriptions   No medications on file   Modified Medications   No medications on file    Planned Follow Up No follow-ups on file.   Total Time in Face-to-Face and in Coordination of Care for patient including independent/personal  interpretation/review of prior testing, medical history, examination, medication adjustment, communicating results with the patient directly, and documentation with the EHR is 25 minutes.   Corliss Parish, MD [facility] Office # [phone]

## 2020-11-07 NOTE — Patient Instructions (Addendum)
Your provider has requested that you go to the basement level for lab work before leaving today. Press "B" on the elevator. The lab is located at the first door on the left as you exit the elevator. (GGT, Direct bilirubin, hepatic function)  Please follow up with Dr Meridee Score on 02/06/21 at 9:10 am.  If you are age 73 or older, your body mass index should be between 23-30. Your Body mass index is 41.94 kg/m. If this is out of the aforementioned range listed, please consider follow up with your Primary Care Provider.  Due to recent changes in healthcare laws, you may see the results of your imaging and laboratory studies on MyChart before your provider has had a chance to review them.  We understand that in some cases there may be results that are confusing or concerning to you. Not all laboratory results come back in the same time frame and the provider may be waiting for multiple results in order to interpret others.  Please give [place] 48 hours in order for your provider to thoroughly review all the results before contacting the office for clarification of your results.   It was a pleasure to see you today!  Dr. Meridee Score   Mediterranean Diet A Mediterranean diet refers to food and lifestyle choices that are based on the traditions of countries located on the [place] Sea. This way of eating has been shown to help prevent certain conditions and improve outcomes for people who have chronic diseases, like kidney disease and heart disease. What are tips for following this plan? Lifestyle  Cook and eat meals together with your family, when possible.  Drink enough fluid to keep your urine clear or pale yellow.  Be physically active every day. This includes: ? Aerobic exercise like running or swimming. ? Leisure activities like gardening, walking, or housework.  Get 7-8 hours of sleep each night.  If recommended by your health care provider, drink red wine in moderation. This means 1 glass  a day for nonpregnant women and 2 glasses a day for men. A glass of wine equals 5 oz (150 mL). Reading food labels  Check the serving size of packaged foods. For foods such as rice and pasta, the serving size refers to the amount of cooked product, not dry.  Check the total fat in packaged foods. Avoid foods that have saturated fat or trans fats.  Check the ingredients list for added sugars, such as corn syrup.   Shopping  At the grocery store, buy most of your food from the areas near the walls of the store. This includes: ? Fresh fruits and vegetables (produce). ? Grains, beans, nuts, and seeds. Some of these may be available in unpackaged forms or large amounts (in bulk). ? Fresh seafood. ? Poultry and eggs. ? Low-fat dairy products.  Buy whole ingredients instead of prepackaged foods.  Buy fresh fruits and vegetables in-season from local farmers markets.  Buy frozen fruits and vegetables in resealable bags.  If you do not have access to quality fresh seafood, buy precooked frozen shrimp or canned fish, such as tuna, salmon, or sardines.  Buy small amounts of raw or cooked vegetables, salads, or olives from the deli or salad bar at your store.  Stock your pantry so you always have certain foods on hand, such as olive oil, canned tuna, canned tomatoes, rice, pasta, and beans. Cooking  Cook foods with extra-virgin olive oil instead of using butter or other vegetable oils.  Have meat as  a side dish, and have vegetables or grains as your main dish. This means having meat in small portions or adding small amounts of meat to foods like pasta or stew.  Use beans or vegetables instead of meat in common dishes like chili or lasagna.  Experiment with different cooking methods. Try roasting or broiling vegetables instead of steaming or sauteing them.  Add frozen vegetables to soups, stews, pasta, or rice.  Add nuts or seeds for added healthy fat at each meal. You can add these to  yogurt, salads, or vegetable dishes.  Marinate fish or vegetables using olive oil, lemon juice, garlic, and fresh herbs. Meal planning  Plan to eat 1 vegetarian meal one day each week. Try to work up to 2 vegetarian meals, if possible.  Eat seafood 2 or more times a week.  Have healthy snacks readily available, such as: ? Vegetable sticks with hummus. ? [place] yogurt. ? Fruit and nut trail mix.  Eat balanced meals throughout the week. This includes: ? Fruit: 2-3 servings a day ? Vegetables: 4-5 servings a day ? Low-fat dairy: 2 servings a day ? Fish, poultry, or lean meat: 1 serving a day ? Beans and legumes: 2 or more servings a week ? Nuts and seeds: 1-2 servings a day ? Whole grains: 6-8 servings a day ? Extra-virgin olive oil: 3-4 servings a day  Limit red meat and sweets to only a few servings a month   What are my food choices?  Mediterranean diet ? Recommended  Grains: Whole-grain pasta. Brown rice. Bulgar wheat. Polenta. Couscous. Whole-wheat bread. Orpah Cobb.  Vegetables: Artichokes. Beets. Broccoli. Cabbage. Carrots. Eggplant. Green beans. Chard. Kale. Spinach. Onions. Leeks. Peas. Squash. Tomatoes. Peppers. Radishes.  Fruits: Apples. Apricots. Avocado. Berries. Bananas. Cherries. Dates. Figs. Grapes. Lemons. Melon. Oranges. Peaches. Plums. Pomegranate.  Meats and other protein foods: Beans. Almonds. Sunflower seeds. Pine nuts. Peanuts. Cod. Salmon. Scallops. Shrimp. Tuna. Tilapia. Clams. Oysters. Eggs.  Dairy: Low-fat milk. Cheese. Greek yogurt.  Beverages: Water. Red wine. Herbal tea.  Fats and oils: Extra virgin olive oil. Avocado oil. Grape seed oil.  Sweets and desserts: [place] yogurt with honey. Baked apples. Poached pears. Trail mix.  Seasoning and other foods: Basil. Cilantro. Coriander. Cumin. Mint. Parsley. Sage. Rosemary. Tarragon. Garlic. Oregano. Thyme. Pepper. Balsalmic vinegar. Tahini. Hummus. Tomato sauce. Olives. Mushrooms. ? Limit  these  Grains: Prepackaged pasta or rice dishes. Prepackaged cereal with added sugar.  Vegetables: Deep fried potatoes (french fries).  Fruits: Fruit canned in syrup.  Meats and other protein foods: Beef. Pork. Lamb. Poultry with skin. Hot dogs. Tomasa Blase.  Dairy: Ice cream. Sour cream. Whole milk.  Beverages: Juice. Sugar-sweetened soft drinks. Beer. Liquor and spirits.  Fats and oils: Butter. Canola oil. Vegetable oil. Beef fat (tallow). Lard.  Sweets and desserts: Cookies. Cakes. Pies. Candy.  Seasoning and other foods: Mayonnaise. Premade sauces and marinades. The items listed may not be a complete list. Talk with your dietitian about what dietary choices are right for you. Summary  The Mediterranean diet includes both food and lifestyle choices.  Eat a variety of fresh fruits and vegetables, beans, nuts, seeds, and whole grains.  Limit the amount of red meat and sweets that you eat.  Talk with your health care provider about whether it is safe for you to drink red wine in moderation. This means 1 glass a day for nonpregnant women and 2 glasses a day for men. A glass of wine equals 5 oz (150 mL). This information is  not intended to replace advice given to you by your health care provider. Make sure you discuss any questions you have with your health care provider. Document Revised: 03/04/2016 Document Reviewed: 02/26/2016 Elsevier Patient Education  [place].

## 2020-11-09 ENCOUNTER — Encounter: Payer: Self-pay | Admitting: Gastroenterology

## 2020-11-09 DIAGNOSIS — R932 Abnormal findings on diagnostic imaging of liver and biliary tract: Secondary | ICD-10-CM | POA: Insufficient documentation

## 2020-11-09 DIAGNOSIS — R17 Unspecified jaundice: Secondary | ICD-10-CM | POA: Insufficient documentation

## 2020-11-09 DIAGNOSIS — K838 Other specified diseases of biliary tract: Secondary | ICD-10-CM | POA: Insufficient documentation

## 2020-11-09 DIAGNOSIS — K760 Fatty (change of) liver, not elsewhere classified: Secondary | ICD-10-CM | POA: Insufficient documentation

## 2021-02-06 ENCOUNTER — Ambulatory Visit: Payer: No Typology Code available for payment source | Admitting: Gastroenterology

## 2021-02-16 ENCOUNTER — Encounter: Payer: Self-pay | Admitting: Gastroenterology

## 2021-02-16 NOTE — Progress Notes (Signed)
Review of outside records (updated since we got the rest of the records)  2017 pathology Gastric body polypectomy consistent with hyperplastic/inflammatory polyp showing focal ulceration and granulation negative for dysplasia and malignancy  2017 EGD Bleeding large polyp found in the gastric anastomotic region.  Polypectomy was performed. Patent gastrojejunostomy with Billroth II. Small bowel normal.  2017 colonoscopy Normal colonoscopy 10-year colonoscopy recommended  These results will be scanned into the chart.  If the patient has a persistent iron deficiency with anemia, query whether the patient has had any recurrence in regards to the gastric hyperplastic polyp that was bleeding previously.  I would recommend at least an upper endoscopy but in the setting of iron deficiency anemia and last colonoscopy being 5 years ago consideration of a colonoscopy should also be had.  We will work on trying to have the patient return to clinic to decide where labs stand and next steps.  He missed his last clinic appointment.    Corliss Parish, MD [facility] Office # [phone]

## 2021-02-19 ENCOUNTER — Other Ambulatory Visit: Payer: Self-pay

## 2021-02-19 DIAGNOSIS — R748 Abnormal levels of other serum enzymes: Secondary | ICD-10-CM

## 2021-02-19 DIAGNOSIS — R7989 Other specified abnormal findings of blood chemistry: Secondary | ICD-10-CM

## 2021-02-19 DIAGNOSIS — K760 Fatty (change of) liver, not elsewhere classified: Secondary | ICD-10-CM

## 2021-02-19 DIAGNOSIS — Z862 Personal history of diseases of the blood and blood-forming organs and certain disorders involving the immune mechanism: Secondary | ICD-10-CM

## 2021-02-19 DIAGNOSIS — R945 Abnormal results of liver function studies: Secondary | ICD-10-CM

## 2021-02-19 NOTE — Progress Notes (Signed)
Thanks for update. GM 

## 2021-02-19 NOTE — Progress Notes (Signed)
Pt to have all pending labs ordered by Dr. Meridee Score 02/23/21. Pt will come to lab on 02/23/21. Pt voiced understanding.

## 2021-02-26 ENCOUNTER — Ambulatory Visit: Payer: No Typology Code available for payment source | Admitting: Gastroenterology

## 2022-03-21 ENCOUNTER — Other Ambulatory Visit: Payer: Self-pay

## 2022-03-21 ENCOUNTER — Encounter (HOSPITAL_COMMUNITY): Payer: Self-pay | Admitting: Emergency Medicine

## 2022-03-21 ENCOUNTER — Emergency Department (HOSPITAL_COMMUNITY)
Admission: EM | Admit: 2022-03-21 | Discharge: 2022-03-21 | Disposition: A | Discharge status: Home / Self Care | Payer: No Typology Code available for payment source | Attending: Emergency Medicine | Admitting: Emergency Medicine

## 2022-03-21 DIAGNOSIS — U071 COVID-19: Secondary | ICD-10-CM | POA: Insufficient documentation

## 2022-03-21 DIAGNOSIS — I10 Essential (primary) hypertension: Secondary | ICD-10-CM | POA: Insufficient documentation

## 2022-03-21 DIAGNOSIS — Z8673 Personal history of transient ischemic attack (TIA), and cerebral infarction without residual deficits: Secondary | ICD-10-CM | POA: Diagnosis not present

## 2022-03-21 DIAGNOSIS — R0981 Nasal congestion: Secondary | ICD-10-CM | POA: Diagnosis present

## 2022-03-21 LAB — SARS CORONAVIRUS 2 BY RT PCR: SARS Coronavirus 2 by RT PCR: POSITIVE — AB

## 2022-03-21 MED ORDER — BENZONATATE 100 MG PO CAPS: 100.0000 mg | ORAL_CAPSULE | Freq: Three times a day (TID) | ORAL | 0 refills | Status: DC

## 2022-03-21 NOTE — Discharge Instructions (Signed)
You decided that you did not want the antiviral medication today.  You may treat your symptoms with over-the-counter medications.  Be sure to stay away from other individuals for the next 5 days.  Ibuprofen is good for any body aches.  You may use Tylenol cold and flu or DayQuil/NyQuil for your other symptoms.  I did send medications for cough to your pharmacy.  Be sure to stay hydrated and return with any shortness of breath, chest pain, worse or concerning symptoms.  [facility]  It was a pleasure to meet you and I hope that you feel better!

## 2022-03-21 NOTE — ED Triage Notes (Signed)
Pt reports scratchy throat and body aches and reports he wants a covid test.

## 2022-03-21 NOTE — ED Provider Notes (Signed)
[facility]-EMERGENCY DEPT Provider Note   CSN: [id number] Arrival date & time: 03/21/22  1304     History  Chief Complaint  Patient presents with   Wants covid test     David Nunez is a 76 y.o. male with a past medical history of hypertension and CVA presenting today with concern for COVID.  Reports URI symptoms since Thursday and his wife testing positive for after being sick.  No shortness of breath or chest pain.   Symptoms include: Congestion, sore throat, body aches, diarrhea a few days ago that has resolved  HPI     Home Medications Prior to Admission medications   Medication Sig Start Date End Date Taking? Authorizing Provider  amLODipine (NORVASC) 10 MG tablet Take 10 mg by mouth daily.    [provider]  cholecalciferol (VITAMIN D3) 25 MCG (1000 UNIT) tablet Take 1,000 Units by mouth daily.    [provider]  cyanocobalamin 500 MCG tablet Take 500 mcg by mouth daily.    [provider]  ferrous sulfate 325 (65 FE) MG tablet Take 325 mg by mouth as directed. MWF    [provider]  hydrochlorothiazide (HYDRODIURIL) 25 MG tablet TAKE ONE TABLET BY MOUTH DAILY **REDUCED DOSE** 09/09/20   [provider]  moxifloxacin (VIGAMOX) 0.5 % ophthalmic solution INSTILL 1 DROP IN RIGHT EYE FOUR TIMES A DAY 09/11/20   [provider]  naproxen sodium (ALEVE) 220 MG tablet Take 220 mg by mouth daily as needed (pain).    [provider]  omeprazole (PRILOSEC) 20 MG capsule Take 20 mg by mouth daily.    [provider]  traMADol (ULTRAM) 50 MG tablet Take 1 tablet by mouth daily as needed. 04/22/16   [provider]      Allergies    Patient has no known allergies.    Review of Systems   Review of Systems  Physical Exam Updated Vital Signs BP (!) 190/96 (BP Location: Right Arm) Comment: I made nurse aware  Pulse (!) 102   Temp 99.6 F (37.6 C) (Oral)   Resp 18   SpO2 93%   Physical Exam Vitals and nursing note reviewed.  Constitutional:      General: He is not in acute distress.    Appearance: Normal appearance. He is not ill-appearing.  HENT:     Head: Normocephalic and atraumatic.     Nose: Nose normal.     Mouth/Throat:     Mouth: Mucous membranes are moist.     Pharynx: Oropharynx is clear. Posterior oropharyngeal erythema present. No oropharyngeal exudate.  Eyes:     General: No scleral icterus.    Conjunctiva/sclera: Conjunctivae normal.  Cardiovascular:     Rate and Rhythm: Normal rate and regular rhythm.  Pulmonary:     Effort: Pulmonary effort is normal. No respiratory distress.     Breath sounds: No wheezing or rhonchi.  Abdominal:     General: Abdomen is flat.     Palpations: Abdomen is soft.  Skin:    Findings: No rash.  Neurological:     Mental Status: He is alert.  Psychiatric:        Mood and Affect: Mood normal.     ED Results / Procedures / Treatments   Labs (all labs ordered are listed, but only abnormal results are displayed) Labs Reviewed  SARS CORONAVIRUS 2 BY RT PCR - Abnormal; Notable for the following components:      Result Value  SARS Coronavirus 2 by RT PCR POSITIVE (*)    All other components within normal limits  BASIC METABOLIC PANEL    EKG None  Radiology No results found.  Procedures Procedures   Medications Ordered in ED Medications - No data to display  ED Course/ Medical Decision Making/ A&P                           Medical Decision Making Amount and/or Complexity of Data Reviewed Labs: ordered.  Risk Prescription drug management.   76 year old male presenting today with URI symptoms.  Test positive for COVID.  Requesting antiviral.  Patient has multiple comorbidities and I believe this is reasonable.  I have run interactions with all of his home medications and Paxlovid and will prescribe this medication.  Work-up: Originally ordered lab work to check GFR however patient ended up  wanting to leave prior to this collection.  MDM/disposition: Patient declined antiviral treatment after unsuccessful blood draws.  We discussed over-the-counter medications as well as return precautions.  He is hypertensive but denies any chest pain, abdominal pain, back pain, headache or blurred vision.  Stable to discharge home   Final Clinical Impression(s) / ED Diagnoses Final diagnoses:  COVID-19    Rx / DC Orders ED Discharge Orders          Ordered    benzonatate (TESSALON) 100 MG capsule  Every 8 hours        03/21/22 1528           Results and diagnoses were explained to the patient. Return precautions discussed in full. Patient had no additional questions and expressed complete understanding.   This chart was dictated using voice recognition software.  Despite best efforts to proofread,  errors can occur which can change the documentation meaning.    Saddie Benders, PA-C 03/21/22 1600    Lorre Nick, MD 03/21/22 [phone]

## 2022-09-06 IMAGING — US US ABDOMEN COMPLETE
1 series · 13 of 25 positions shown · non-contrast
Comparison: Nuclear medicine hepatobiliary imaging 01/18/2020, CT
renal 09/26/2015

CLINICAL DATA: Abnormal EGD, elevated GGT.

EXAM:
ABDOMEN ULTRASOUND COMPLETE

[Series 1: us abdomen complete · 13 of 70 slices shown]
[im 1/70]
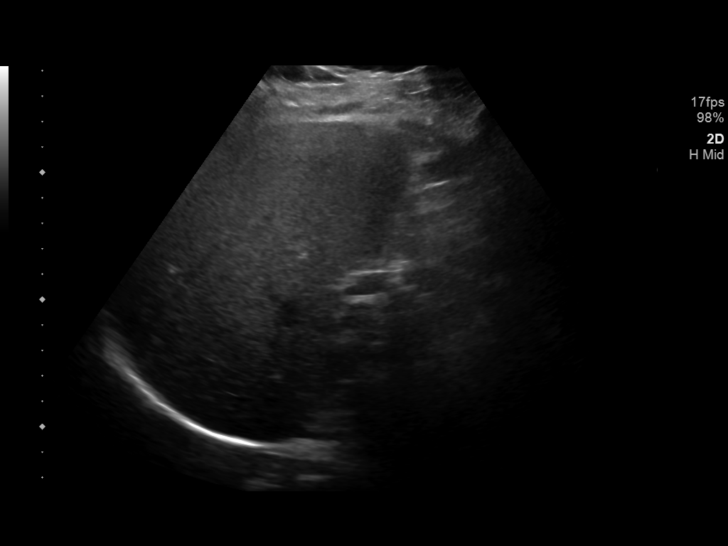
[im 6/70]
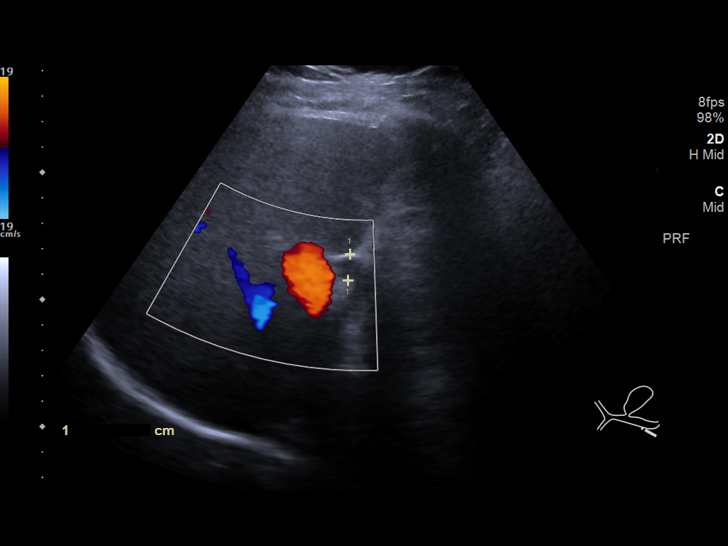
[im 12/70]
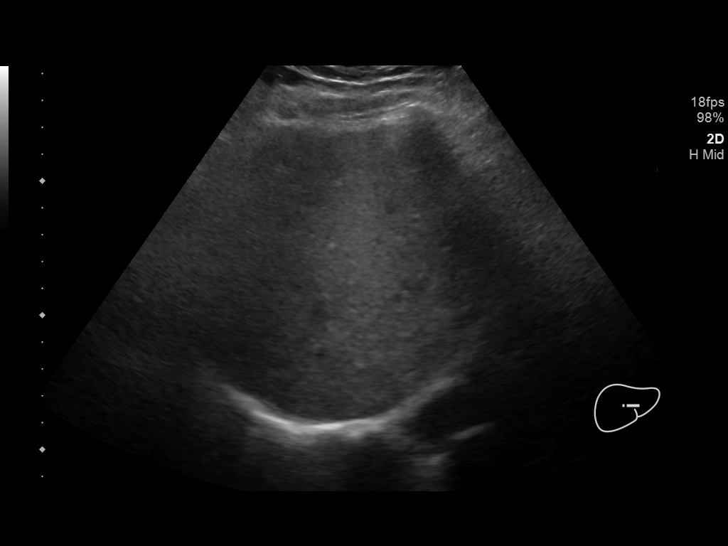
[im 18/70]
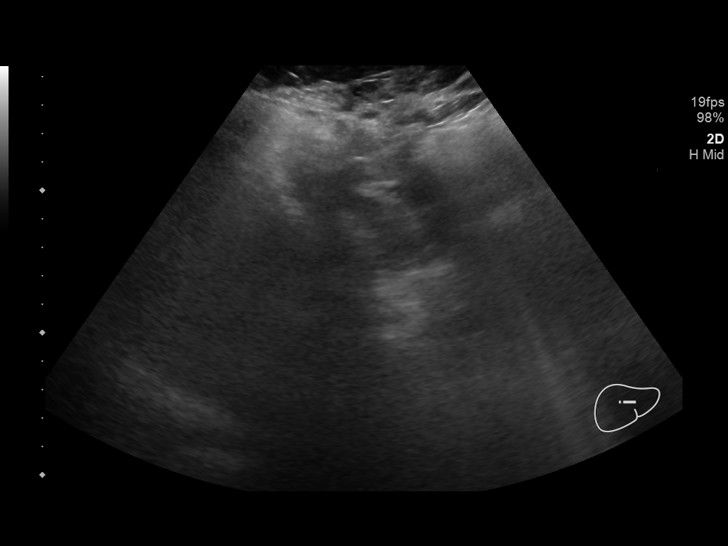
[im 24/70]
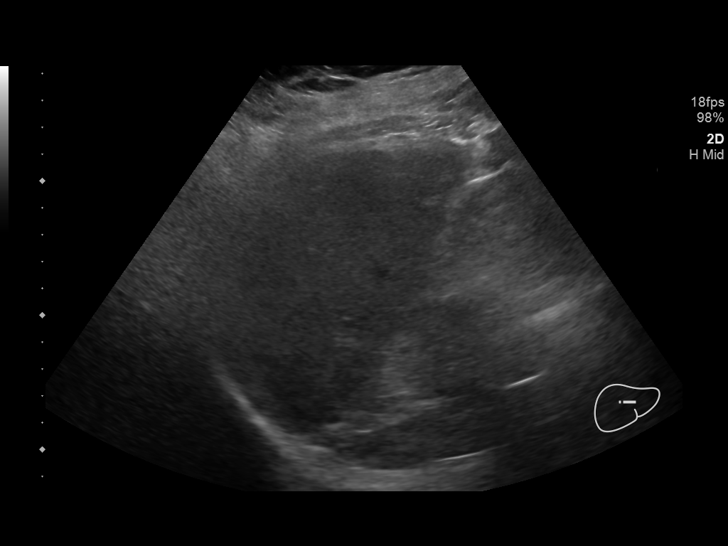
[im 29/70]
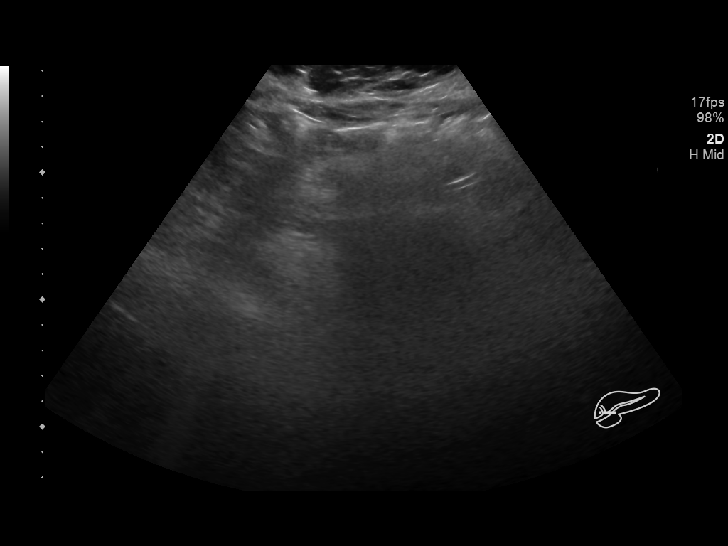
[im 35/70]
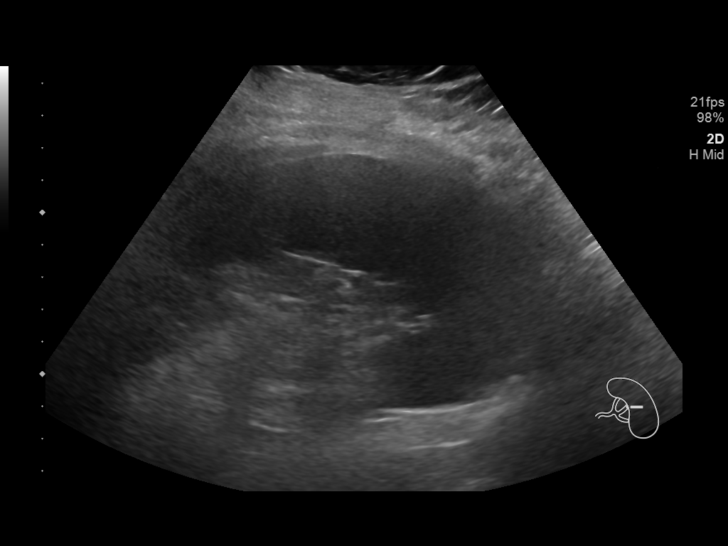
[im 41/70]
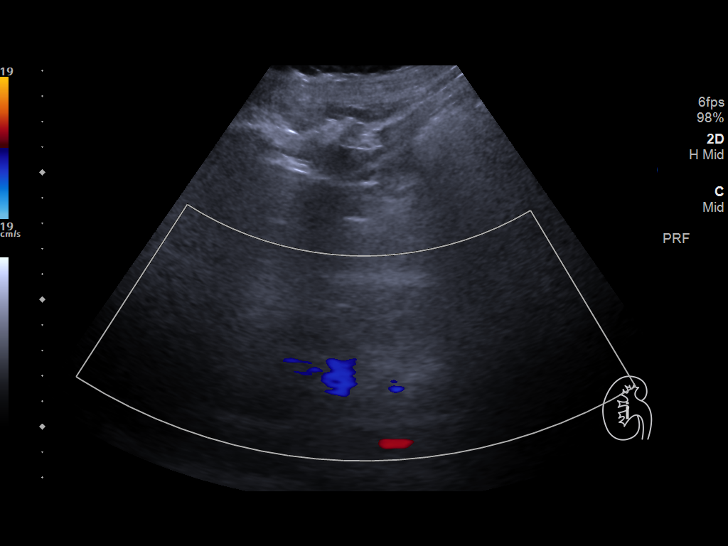
[im 47/70]
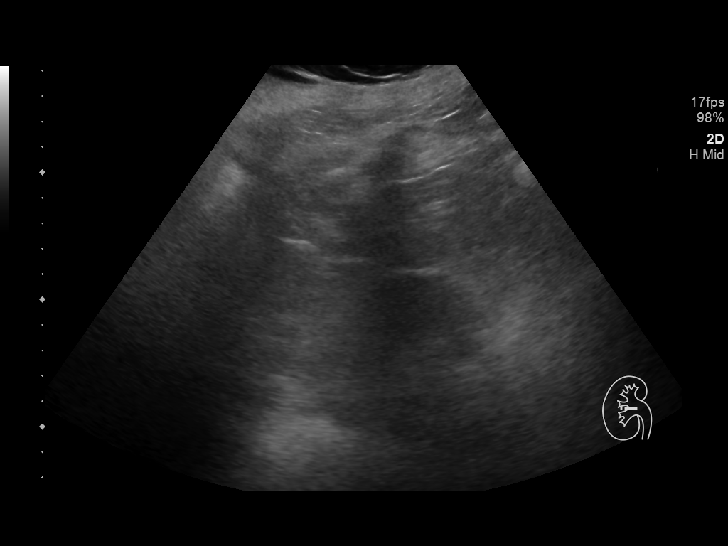
[im 52/70]
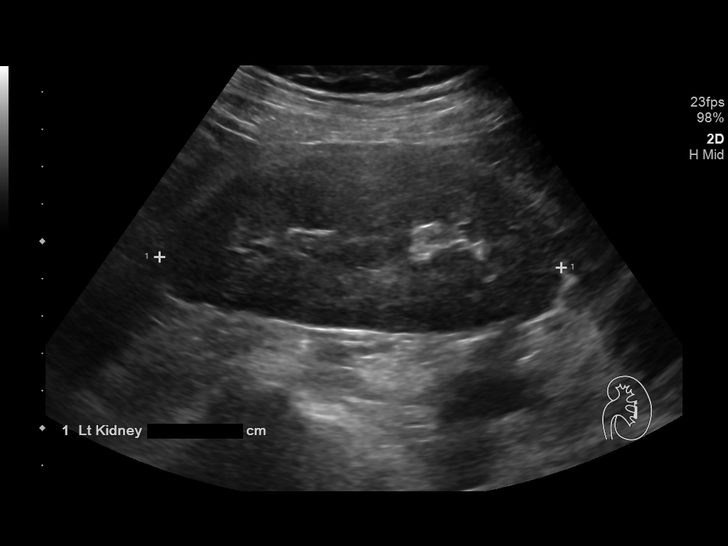
[im 58/70]
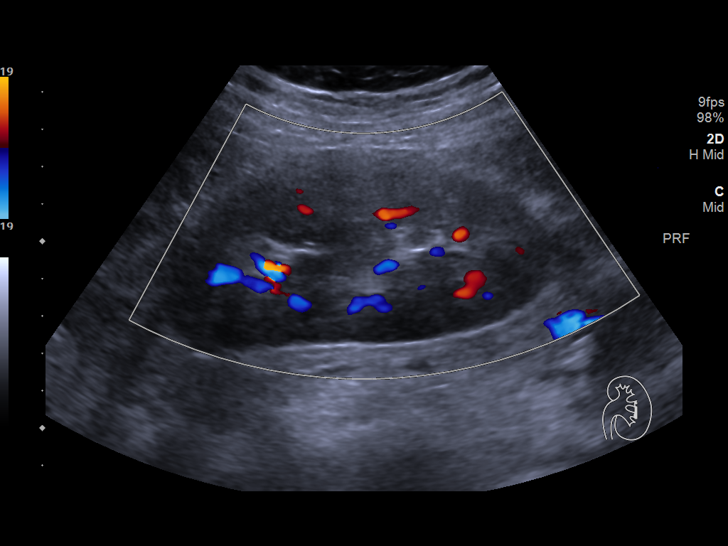
[im 64/70]
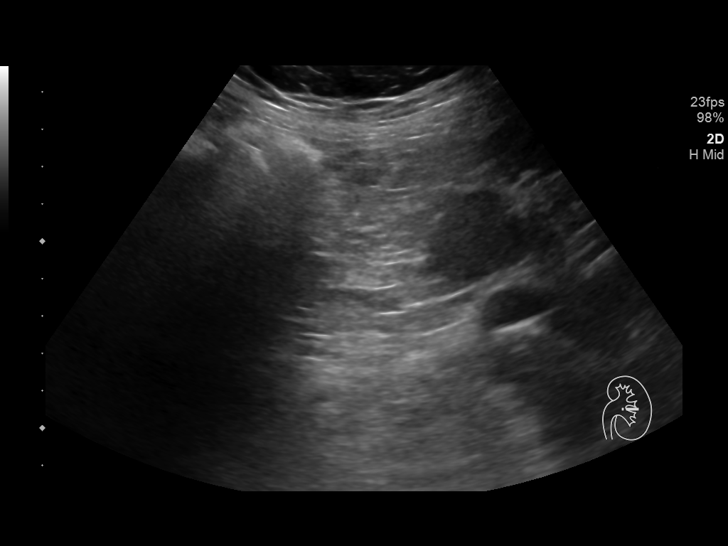
[im 70/70]
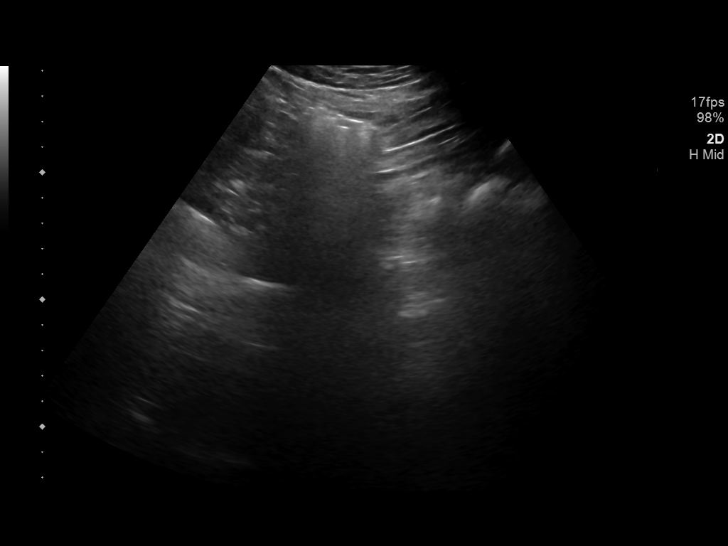

[13 of 25 positions shown; findings below may reference images not displayed]

FINDINGS: Gallbladder: Status post cholecystectomy.

Common bile duct: Diameter: Enlarged measuring up to 10 mm.

Liver: No focal lesion identified. Increased parenchymal
echogenicity. Portal vein is patent on color Doppler imaging with
normal direction of blood flow towards the liver.

IVC: Not visualized.

Pancreas: Not visualized.

Spleen: Size and appearance within normal limits.

Right Kidney: Length: 12 cm. Echogenicity within normal limits.
There is a simple appearing 2.7 cm cystic lesion. No mass or
hydronephrosis visualized.

Left Kidney: Length: 10.8 cm. Echogenicity within normal limits. No
mass or hydronephrosis visualized.

Abdominal aorta: Partially visualized.  No aneurysm visualized.

Other findings: None.
IMPRESSION: 1. Markedly limited evaluation with nonvisualization of the inferior
vena cava and pancreas.
2. Hepatic steatosis. Please note limited evaluation for focal
hepatic masses in a patient with hepatic steatosis due to decreased
penetration of the acoustic ultrasound waves. Recommend MRI liver
protocol for further evaluation.
3. Enlarged common bile duct measuring up to 10 mm. Finding can be
normal in the setting of post cholecystectomy as in this patient. If
clinically indicated, consider MRCP for further evaluation.

These results will be called to the ordering clinician or
representative by the Radiologist Assistant, and communication
documented in the PACS or [REDACTED].

## 2023-07-12 ENCOUNTER — Inpatient Hospital Stay (HOSPITAL_COMMUNITY)
Admission: EM | Admit: 2023-07-12 | Discharge: 2023-07-15 | DRG: 389 | Disposition: A | Discharge status: Home / Self Care | Payer: No Typology Code available for payment source | Attending: Student | Admitting: Student

## 2023-07-12 ENCOUNTER — Emergency Department (HOSPITAL_COMMUNITY): Payer: No Typology Code available for payment source

## 2023-07-12 ENCOUNTER — Observation Stay (HOSPITAL_COMMUNITY): Payer: No Typology Code available for payment source

## 2023-07-12 ENCOUNTER — Other Ambulatory Visit: Payer: Self-pay

## 2023-07-12 ENCOUNTER — Encounter (HOSPITAL_COMMUNITY): Payer: Self-pay

## 2023-07-12 DIAGNOSIS — Z96653 Presence of artificial knee joint, bilateral: Secondary | ICD-10-CM | POA: Diagnosis present

## 2023-07-12 DIAGNOSIS — K56609 Unspecified intestinal obstruction, unspecified as to partial versus complete obstruction: Secondary | ICD-10-CM | POA: Diagnosis present

## 2023-07-12 DIAGNOSIS — Z79899 Other long term (current) drug therapy: Secondary | ICD-10-CM

## 2023-07-12 DIAGNOSIS — K5909 Other constipation: Secondary | ICD-10-CM | POA: Diagnosis present

## 2023-07-12 DIAGNOSIS — Z8711 Personal history of peptic ulcer disease: Secondary | ICD-10-CM

## 2023-07-12 DIAGNOSIS — K5651 Intestinal adhesions [bands], with partial obstruction: Secondary | ICD-10-CM | POA: Diagnosis not present

## 2023-07-12 DIAGNOSIS — Z9049 Acquired absence of other specified parts of digestive tract: Secondary | ICD-10-CM

## 2023-07-12 DIAGNOSIS — Z6841 Body Mass Index (BMI) 40.0 and over, adult: Secondary | ICD-10-CM

## 2023-07-12 DIAGNOSIS — I10 Essential (primary) hypertension: Secondary | ICD-10-CM | POA: Diagnosis present

## 2023-07-12 DIAGNOSIS — R109 Unspecified abdominal pain: Principal | ICD-10-CM

## 2023-07-12 DIAGNOSIS — N390 Urinary tract infection, site not specified: Secondary | ICD-10-CM

## 2023-07-12 DIAGNOSIS — R1110 Vomiting, unspecified: Secondary | ICD-10-CM | POA: Diagnosis present

## 2023-07-12 DIAGNOSIS — R112 Nausea with vomiting, unspecified: Secondary | ICD-10-CM | POA: Diagnosis not present

## 2023-07-12 DIAGNOSIS — Z8673 Personal history of transient ischemic attack (TIA), and cerebral infarction without residual deficits: Secondary | ICD-10-CM

## 2023-07-12 DIAGNOSIS — E669 Obesity, unspecified: Secondary | ICD-10-CM | POA: Diagnosis present

## 2023-07-12 DIAGNOSIS — N400 Enlarged prostate without lower urinary tract symptoms: Secondary | ICD-10-CM | POA: Diagnosis present

## 2023-07-12 LAB — COMPREHENSIVE METABOLIC PANEL
ALT: 15 U/L (ref 0–44)
AST: 14 U/L — ABNORMAL LOW (ref 15–41)
Albumin: 3.8 g/dL (ref 3.5–5.0)
Alkaline Phosphatase: 85 U/L (ref 38–126)
Anion gap: 7 (ref 5–15)
BUN: 10 mg/dL (ref 8–23)
CO2: 26 mmol/L (ref 22–32)
Calcium: 9.5 mg/dL (ref 8.9–10.3)
Chloride: 100 mmol/L (ref 98–111)
Creatinine, Ser: 0.87 mg/dL (ref 0.61–1.24)
GFR, Estimated: >60 mL/min (ref >60)
Glucose, Bld: 100 mg/dL — ABNORMAL HIGH (ref 70–99)
Potassium: 4.3 mmol/L (ref 3.5–5.1)
Sodium: 133 mmol/L — ABNORMAL LOW (ref 135–145)
Total Bilirubin: 1.2 mg/dL — ABNORMAL HIGH (ref <1.2)
Total Protein: 7.2 g/dL (ref 6.5–8.1)

## 2023-07-12 LAB — CBC
HCT: 40.5 % (ref 39.0–52.0)
Hemoglobin: 13.3 g/dL (ref 13.0–17.0)
MCH: 27.8 pg (ref 26.0–34.0)
MCHC: 32.8 g/dL (ref 30.0–36.0)
MCV: 84.6 fL (ref 80.0–100.0)
Platelets: 185 10*3/uL (ref 150–400)
RBC: 4.79 MIL/uL (ref 4.22–5.81)
RDW: 13.9 % (ref 11.5–15.5)
WBC: 10.5 10*3/uL (ref 4.0–10.5)
nRBC: 0 % (ref 0.0–0.2)

## 2023-07-12 LAB — CBC WITH DIFFERENTIAL/PLATELET
Abs Immature Granulocytes: 0.04 10*3/uL (ref 0.00–0.07)
Basophils Absolute: 0 10*3/uL (ref 0.0–0.1)
Basophils Relative: 0 %
Eosinophils Absolute: 0.1 10*3/uL (ref 0.0–0.5)
Eosinophils Relative: 1 %
HCT: 44.4 % (ref 39.0–52.0)
Hemoglobin: 14.5 g/dL (ref 13.0–17.0)
Immature Granulocytes: 0 %
Lymphocytes Relative: 11 %
Lymphs Abs: 1 10*3/uL (ref 0.7–4.0)
MCH: 27.4 pg (ref 26.0–34.0)
MCHC: 32.7 g/dL (ref 30.0–36.0)
MCV: 83.8 fL (ref 80.0–100.0)
Monocytes Absolute: 0.5 10*3/uL (ref 0.1–1.0)
Monocytes Relative: 5 %
Neutro Abs: 8.1 10*3/uL — ABNORMAL HIGH (ref 1.7–7.7)
Neutrophils Relative %: 83 %
Platelets: 214 10*3/uL (ref 150–400)
RBC: 5.3 MIL/uL (ref 4.22–5.81)
RDW: 14 % (ref 11.5–15.5)
WBC: 9.7 10*3/uL (ref 4.0–10.5)
nRBC: 0 % (ref 0.0–0.2)

## 2023-07-12 LAB — URINALYSIS, W/ REFLEX TO CULTURE (INFECTION SUSPECTED)
Bacteria, UA: NONE SEEN
Bilirubin Urine: NEGATIVE
Glucose, UA: NEGATIVE mg/dL
Hgb urine dipstick: NEGATIVE
Ketones, ur: NEGATIVE mg/dL
Nitrite: NEGATIVE
Protein, ur: NEGATIVE mg/dL
Specific Gravity, Urine: 1.011 (ref 1.005–1.030)
pH: 8 (ref 5.0–8.0)

## 2023-07-12 LAB — CREATININE, SERUM
Creatinine, Ser: 0.86 mg/dL (ref 0.61–1.24)
GFR, Estimated: >60 mL/min (ref >60)

## 2023-07-12 LAB — TROPONIN I (HIGH SENSITIVITY): Troponin I (High Sensitivity): 7 ng/L (ref <18)

## 2023-07-12 LAB — LIPASE, BLOOD: Lipase: 24 U/L (ref 11–51)

## 2023-07-12 MED ORDER — VITAMIN D 25 MCG (1000 UNIT) PO TABS
2000.0000 [IU] | ORAL_TABLET | Freq: Every day | ORAL | Status: DC
  Administered 2023-07-14 – 2023-07-15 (×2): 2000 [IU] via ORAL
  Filled 2023-07-12 (×2): qty 2

## 2023-07-12 MED ORDER — SODIUM CHLORIDE 0.9 % IV BOLUS
1000.0000 mL | Freq: Once | INTRAVENOUS | Status: AC
  Administered 2023-07-12: 1000 mL via INTRAVENOUS

## 2023-07-12 MED ORDER — SODIUM CHLORIDE 0.9 % IV SOLN
1.0000 g | INTRAVENOUS | Status: DC
  Administered 2023-07-12 – 2023-07-13 (×2): 1 g via INTRAVENOUS
  Filled 2023-07-12 (×4): qty 10

## 2023-07-12 MED ORDER — LISINOPRIL 10 MG PO TABS
10.0000 mg | ORAL_TABLET | Freq: Every day | ORAL | Status: DC
  Administered 2023-07-12 – 2023-07-13 (×2): 10 mg via ORAL
  Filled 2023-07-12 (×2): qty 1

## 2023-07-12 MED ORDER — POLYETHYLENE GLYCOL 3350 17 G PO PACK
17.0000 g | PACK | Freq: Every day | ORAL | Status: DC | PRN
Start: 2023-07-12 — End: 2023-07-14

## 2023-07-12 MED ORDER — ENOXAPARIN SODIUM 40 MG/0.4ML IJ SOSY: 40.0000 mg | PREFILLED_SYRINGE | INTRAMUSCULAR | Status: DC

## 2023-07-12 MED ORDER — KCL-LACTATED RINGERS-D5W 20 MEQ/L IV SOLN
INTRAVENOUS | Status: AC
  Filled 2023-07-12 (×3): qty 1000

## 2023-07-12 MED ORDER — PANTOPRAZOLE SODIUM 40 MG IV SOLR
40.0000 mg | Freq: Every day | INTRAVENOUS | Status: DC
  Administered 2023-07-12 – 2023-07-15 (×4): 40 mg via INTRAVENOUS
  Filled 2023-07-12 (×4): qty 10

## 2023-07-12 MED ORDER — ACETAMINOPHEN 650 MG RE SUPP: 650.0000 mg | Freq: Four times a day (QID) | RECTAL | Status: DC | PRN

## 2023-07-12 MED ORDER — HYDROCHLOROTHIAZIDE 12.5 MG PO TABS: 25.0000 mg | ORAL_TABLET | ORAL | Status: DC

## 2023-07-12 MED ORDER — TRAMADOL HCL 50 MG PO TABS
50.0000 mg | ORAL_TABLET | Freq: Two times a day (BID) | ORAL | Status: DC | PRN
  Administered 2023-07-13 (×2): 50 mg via ORAL
  Filled 2023-07-12 (×2): qty 1

## 2023-07-12 MED ORDER — MORPHINE SULFATE (PF) 4 MG/ML IV SOLN
6.0000 mg | Freq: Once | INTRAVENOUS | Status: AC
  Administered 2023-07-12: 6 mg via INTRAVENOUS
  Filled 2023-07-12: qty 2

## 2023-07-12 MED ORDER — PHENOL 1.4 % MT LIQD
1.0000 | OROMUCOSAL | Status: DC | PRN
  Administered 2023-07-12: 1 via OROMUCOSAL
  Filled 2023-07-12: qty 177

## 2023-07-12 MED ORDER — ONDANSETRON HCL 4 MG/2ML IJ SOLN
4.0000 mg | Freq: Once | INTRAMUSCULAR | Status: AC
  Administered 2023-07-12: 4 mg via INTRAVENOUS
  Filled 2023-07-12: qty 2

## 2023-07-12 MED ORDER — DICLOFENAC SODIUM 1 % EX GEL
4.0000 g | Freq: Three times a day (TID) | CUTANEOUS | Status: DC | PRN
  Administered 2023-07-13: 4 g via TOPICAL
  Filled 2023-07-12: qty 100

## 2023-07-12 MED ORDER — HYDRALAZINE HCL 20 MG/ML IJ SOLN
10.0000 mg | INTRAMUSCULAR | Status: DC | PRN
  Administered 2023-07-13: 10 mg via INTRAVENOUS
  Filled 2023-07-12: qty 1

## 2023-07-12 MED ORDER — FINASTERIDE 5 MG PO TABS
5.0000 mg | ORAL_TABLET | Freq: Every day | ORAL | Status: DC
  Administered 2023-07-12 – 2023-07-15 (×3): 5 mg via ORAL
  Filled 2023-07-12 (×3): qty 1

## 2023-07-12 MED ORDER — CELECOXIB 200 MG PO CAPS
200.0000 mg | ORAL_CAPSULE | Freq: Two times a day (BID) | ORAL | Status: DC
  Filled 2023-07-12: qty 1

## 2023-07-12 MED ORDER — MORPHINE SULFATE (PF) 2 MG/ML IV SOLN
1.0000 mg | Freq: Once | INTRAVENOUS | Status: AC
  Administered 2023-07-12: 1 mg via INTRAVENOUS
  Filled 2023-07-12: qty 1

## 2023-07-12 MED ORDER — ENOXAPARIN SODIUM 60 MG/0.6ML IJ SOSY
60.0000 mg | PREFILLED_SYRINGE | INTRAMUSCULAR | Status: DC
  Administered 2023-07-13 – 2023-07-15 (×3): 60 mg via SUBCUTANEOUS
  Filled 2023-07-12 (×3): qty 0.6

## 2023-07-12 MED ORDER — LABETALOL HCL 5 MG/ML IV SOLN: 20.0000 mg | INTRAVENOUS | Status: DC | PRN

## 2023-07-12 MED ORDER — SODIUM CHLORIDE 0.9 % IV SOLN: INTRAVENOUS | Status: DC

## 2023-07-12 MED ORDER — SODIUM CHLORIDE 0.9% FLUSH
3.0000 mL | Freq: Two times a day (BID) | INTRAVENOUS | Status: DC
  Administered 2023-07-12 – 2023-07-15 (×6): 3 mL via INTRAVENOUS

## 2023-07-12 MED ORDER — ACETAMINOPHEN 325 MG PO TABS
650.0000 mg | ORAL_TABLET | Freq: Four times a day (QID) | ORAL | Status: DC | PRN
  Filled 2023-07-12: qty 2

## 2023-07-12 MED ORDER — ONDANSETRON HCL 4 MG/2ML IJ SOLN
4.0000 mg | Freq: Four times a day (QID) | INTRAMUSCULAR | Status: DC | PRN
  Administered 2023-07-14 (×2): 4 mg via INTRAVENOUS
  Filled 2023-07-12 (×2): qty 2

## 2023-07-12 MED ORDER — AMLODIPINE BESYLATE 10 MG PO TABS
10.0000 mg | ORAL_TABLET | Freq: Every day | ORAL | Status: DC
  Administered 2023-07-12 – 2023-07-13 (×2): 10 mg via ORAL
  Filled 2023-07-12 (×2): qty 1

## 2023-07-12 MED ORDER — SENNOSIDES-DOCUSATE SODIUM 8.6-50 MG PO TABS
2.0000 | ORAL_TABLET | Freq: Two times a day (BID) | ORAL | Status: DC
  Administered 2023-07-13 – 2023-07-15 (×4): 2 via ORAL
  Filled 2023-07-12 (×5): qty 2

## 2023-07-12 MED ORDER — IOHEXOL 300 MG/ML  SOLN
100.0000 mL | Freq: Once | INTRAMUSCULAR | Status: AC | PRN
Start: 2023-07-12 — End: 2023-07-12
  Administered 2023-07-12: 100 mL via INTRAVENOUS

## 2023-07-12 NOTE — Progress Notes (Signed)
Patinet has had NGT placed per earlier ER order. At this time, will clamp NGT and monitor aptient on iv fluids. If tolerating well, plan to advance diet at that stage.

## 2023-07-12 NOTE — ED Provider Notes (Signed)
[place] EMERGENCY DEPARTMENT AT [facility] Provider Note   CSN: [id number] Arrival date & time: 07/12/23  4098     History  Chief Complaint  Patient presents with   Abdominal Pain    NORBERTO PROFFITT is a 77 y.o. male.  77 year old male presents with several weeks of intermittent right side abdominal pain which has become worse since last night.  Denies any fever or chills.  Has had emesis x 3 which has been nonbilious or bloody.  States he has passed some hard stools.  He is status post cholecystectomy as well as surgery for a gastrointestinal ulcer.  Denies any black stools at this time.  No weakness.  No treatment use prior to arrival       Home Medications Prior to Admission medications   Medication Sig Start Date End Date Taking? Authorizing Provider  amLODipine (NORVASC) 10 MG tablet Take 10 mg by mouth daily.    [provider]  benzonatate (TESSALON) 100 MG capsule Take 1 capsule (100 mg total) by mouth every 8 (eight) hours. 03/21/22   Redwine, Madison A, PA-C  cholecalciferol (VITAMIN D3) 25 MCG (1000 UNIT) tablet Take 1,000 Units by mouth daily.    [provider]  cyanocobalamin 500 MCG tablet Take 500 mcg by mouth daily.    [provider]  ferrous sulfate 325 (65 FE) MG tablet Take 325 mg by mouth as directed. MWF    [provider]  hydrochlorothiazide (HYDRODIURIL) 25 MG tablet TAKE ONE TABLET BY MOUTH DAILY **REDUCED DOSE** 09/09/20   [provider]  moxifloxacin (VIGAMOX) 0.5 % ophthalmic solution INSTILL 1 DROP IN RIGHT EYE FOUR TIMES A DAY 09/11/20   [provider]  naproxen sodium (ALEVE) 220 MG tablet Take 220 mg by mouth daily as needed (pain).    [provider]  omeprazole (PRILOSEC) 20 MG capsule Take 20 mg by mouth daily.    [provider]  traMADol (ULTRAM) 50 MG tablet Take 1 tablet by mouth daily as needed. 04/22/16   [provider]      Allergies     Patient has no known allergies.    Review of Systems   Review of Systems  All other systems reviewed and are negative.   Physical Exam Updated Vital Signs BP (!) 180/101 (BP Location: Right Arm)   Pulse 83   Temp 97.6 F (36.4 C) (Oral)   Resp 18   Ht 1.727 m (5\' 8" )   Wt 124.7 kg   SpO2 95%   BMI 41.81 kg/m  Physical Exam Vitals and nursing note reviewed.  Constitutional:      General: He is not in acute distress.    Appearance: Normal appearance. He is well-developed. He is not toxic-appearing.  HENT:     Head: Normocephalic and atraumatic.  Eyes:     General: Lids are normal.     Conjunctiva/sclera: Conjunctivae normal.     Pupils: Pupils are equal, round, and reactive to light.  Neck:     Thyroid: No thyroid mass.     Trachea: No tracheal deviation.  Cardiovascular:     Rate and Rhythm: Normal rate and regular rhythm.     Heart sounds: Normal heart sounds. No murmur heard.    No gallop.  Pulmonary:     Effort: Pulmonary effort is normal. No respiratory distress.     Breath sounds: Normal breath sounds. No stridor. No decreased breath sounds, wheezing, rhonchi or rales.  Abdominal:  General: There is distension.     Palpations: Abdomen is soft.     Tenderness: There is no abdominal tenderness. There is guarding. There is no rebound.    Musculoskeletal:        General: No tenderness. Normal range of motion.     Cervical back: Normal range of motion and neck supple.  Skin:    General: Skin is warm and dry.     Findings: No abrasion or rash.  Neurological:     Mental Status: He is alert and oriented to person, place, and time. Mental status is at baseline.     GCS: GCS eye subscore is 4. GCS verbal subscore is 5. GCS motor subscore is 6.     Cranial Nerves: No cranial nerve deficit.     Sensory: No sensory deficit.     Motor: Motor function is intact.  Psychiatric:        Attention and Perception: Attention normal.        Speech: Speech normal.         Behavior: Behavior normal.     ED Results / Procedures / Treatments   Labs (all labs ordered are listed, but only abnormal results are displayed) Labs Reviewed  CBC WITH DIFFERENTIAL/PLATELET  COMPREHENSIVE METABOLIC PANEL  URINALYSIS, W/ REFLEX TO CULTURE (INFECTION SUSPECTED)  LIPASE, BLOOD    EKG None  Radiology No results found.  Procedures Procedures    Medications Ordered in ED Medications  0.9 %  sodium chloride infusion (has no administration in time range)  sodium chloride 0.9 % bolus 1,000 mL (has no administration in time range)  ondansetron (ZOFRAN) injection 4 mg (has no administration in time range)    ED Course/ Medical Decision Making/ A&P                                 Medical Decision Making Amount and/or Complexity of Data Reviewed Labs: ordered. Radiology: ordered.  Risk Prescription drug management.   Patient's urinalysis is infection and patient started on IV Rocephin.  Patient abdominal distention concern for possible bowel obstruction and abdominal CT performed which showed likely developing a bowel obstruction.  Patient continues to feel nauseated.  Will have nursing insert NG tube.  Will medicate with IV pain medication here.  Patient will require admission and will talk to hospitalist team        Final Clinical Impression(s) / ED Diagnoses Final diagnoses:  None    Rx / DC Orders ED Discharge Orders     None         Lorre Nick, MD 07/12/23 1455

## 2023-07-12 NOTE — Assessment & Plan Note (Addendum)
This likely represents GI upset/gastritis likely due to patient having had previously cooked reheated food last evening.  Patient looking nontoxic at this time.  Reviewed CAT scan findings as above, I suspect jejunal dilation is likely due to fluid versus peristaltic movement.  Clinically given that the patient is passing gas, having sluggish bowel sounds, not felt to be true obstruction at this time.  Patient is already tolerating clears which will be continued and escalated as patient is able to tolerate.  Will get a troponin level. Pantoprazole and DVT ppx ordered

## 2023-07-12 NOTE — Assessment & Plan Note (Signed)
See HPI, going on for about 3 to 4 weeks, exacerbated by straining or, patient reported lip reported to me later also by coughing today.  Felt to be musculoskeletal based on above.  I will treat with laxatives and pain medications.

## 2023-07-12 NOTE — ED Notes (Signed)
ED TO INPATIENT HANDOFF REPORT  ED Nurse Name and Phone #:  Leatrice Jewels, RN  S Name/Age/Gender David Nunez 77 y.o. male Room/Bed: WA16/WA16  Code Status   Code Status: Full Code  Home/SNF/Other Home Patient oriented to: self, place, time, and situation Is this baseline? Yes   Triage Complete: Triage complete  Chief Complaint Vomiting [R11.10]  Triage Note Pt reports right side abdominal pain x3 weeks. States last night started vomiting. Reports has been constipated as well. No urinary changes.    Allergies No Known Allergies  Level of Care/Admitting Diagnosis ED Disposition     ED Disposition  Admit   Condition  --   Comment  Hospital Area: [facility] [[id number]]  Level of Care: Med-Surg [16]  May place patient in observation at [facility] or [facility] if equivalent level of care is available:: No  Covid Evaluation: Asymptomatic - no recent exposure (last 10 days) testing not required  Diagnosis: Vomiting [207392]  Admitting Physician: Nolberto Hanlon [[id number]]  Attending Physician: Nolberto Hanlon [[id number]]          B Medical/Surgery History Past Medical History:  Diagnosis Date   Anemia    BPH (benign prostatic hyperplasia)    Cataracts, bilateral    Elevated LFTs    Hypertension    Past Surgical History:  Procedure Laterality Date   cataract surgery Right    CHOLECYSTECTOMY N/A 01/14/2020   Procedure: DIAGNOSTIC LAPAROSCOPY WITH OPEN CHOLECYSTECTOMY AND INTRAOPERATIVE CHOLANGIOGRAM;  Surgeon: Darnell Level, MD;  Location: [facility];  Service: General;  Laterality: N/A;   gastric ulcer surgery     REPLACEMENT TOTAL KNEE Bilateral      A IV Location/Drains/Wounds Patient Lines/Drains/Airways Status     Active Line/Drains/Airways     Name Placement date Placement time Site Days   Peripheral IV 07/12/23 20 G Left Antecubital 07/12/23  1114  Antecubital  less than 1   Peripheral IV 07/12/23 20 G 1" Right Antecubital 07/12/23  1229   Antecubital  less than 1   Incision (Closed) 01/14/20 Abdomen Other (Comment) 01/14/20  0943  -- 1275   Incision - 1 Port Abdomen Umbilicus 01/14/20  0815  -- 1275            Intake/Output Last 24 hours No intake or output data in the 24 hours ending 07/12/23 1722  Labs/Imaging Results for orders placed or performed during the hospital encounter of 07/12/23 (from the past 48 hours)  CBC with Differential/Platelet     Status: Abnormal   Collection Time: 07/12/23 11:55 AM  Result Value Ref Range   WBC 9.7 4.0 - 10.5 K/uL   RBC 5.30 4.22 - 5.81 MIL/uL   Hemoglobin 14.5 13.0 - 17.0 g/dL   HCT 62.1 30.8 - 65.7 %   MCV 83.8 80.0 - 100.0 fL   MCH 27.4 26.0 - 34.0 pg   MCHC 32.7 30.0 - 36.0 g/dL   RDW 84.6 96.2 - 95.2 %   Platelets 214 150 - 400 K/uL   nRBC 0.0 0.0 - 0.2 %   Neutrophils Relative % 83 %   Neutro Abs 8.1 (H) 1.7 - 7.7 K/uL   Lymphocytes Relative 11 %   Lymphs Abs 1.0 0.7 - 4.0 K/uL   Monocytes Relative 5 %   Monocytes Absolute 0.5 0.1 - 1.0 K/uL   Eosinophils Relative 1 %   Eosinophils Absolute 0.1 0.0 - 0.5 K/uL   Basophils Relative 0 %   Basophils Absolute 0.0 0.0 -  0.1 K/uL   Immature Granulocytes 0 %   Abs Immature Granulocytes 0.04 0.00 - 0.07 K/uL    Comment: Performed at [facility], [address], [place], [place] [address]  Comprehensive metabolic panel     Status: Abnormal   Collection Time: 07/12/23 12:30 PM  Result Value Ref Range   Sodium 133 (L) 135 - 145 mmol/L   Potassium 4.3 3.5 - 5.1 mmol/L   Chloride 100 98 - 111 mmol/L   CO2 26 22 - 32 mmol/L   Glucose, Bld 100 (H) 70 - 99 mg/dL    Comment: Glucose reference range applies only to samples taken after fasting for at least 8 hours.   BUN 10 8 - 23 mg/dL   Creatinine, Ser 8.65 0.61 - 1.24 mg/dL   Calcium 9.5 8.9 - 78.4 mg/dL   Total Protein 7.2 6.5 - 8.1 g/dL   Albumin 3.8 3.5 - 5.0 g/dL   AST 14 (L) 15 - 41 U/L   ALT 15 0 - 44 U/L   Alkaline Phosphatase 85 38 - 126  U/L   Total Bilirubin 1.2 (H) <1.2 mg/dL   GFR, Estimated >69 >62 mL/min    Comment: (NOTE) Calculated using the CKD-EPI Creatinine Equation (2021)    Anion gap 7 5 - 15    Comment: Performed at [facility], [address], [place], [place] [address]  Lipase, blood     Status: None   Collection Time: 07/12/23 12:30 PM  Result Value Ref Range   Lipase 24 11 - 51 U/L    Comment: Performed at [facility], [address], [place], [place] [address]  Urinalysis, w/ Reflex to Culture (Infection Suspected) -Urine, Clean Catch     Status: Abnormal   Collection Time: 07/12/23 12:50 PM  Result Value Ref Range   Specimen Source URINE, CLEAN CATCH    Color, Urine YELLOW YELLOW   APPearance CLEAR CLEAR   Specific Gravity, Urine 1.011 1.005 - 1.030   pH 8.0 5.0 - 8.0   Glucose, UA NEGATIVE NEGATIVE mg/dL   Hgb urine dipstick NEGATIVE NEGATIVE   Bilirubin Urine NEGATIVE NEGATIVE   Ketones, ur NEGATIVE NEGATIVE mg/dL   Protein, ur NEGATIVE NEGATIVE mg/dL   Nitrite NEGATIVE NEGATIVE   Leukocytes,Ua LARGE (A) NEGATIVE   RBC / HPF 6-10 0 - 5 RBC/hpf   WBC, UA 21-50 0 - 5 WBC/hpf    Comment:        Reflex urine culture not performed if WBC <=10, OR if Squamous epithelial cells >5. If Squamous epithelial cells >5 suggest recollection.    Bacteria, UA NONE SEEN NONE SEEN   Squamous Epithelial / HPF 0-5 0 - 5 /HPF    Comment: Performed at [facility], [address] [place], [place] [address]   CT ABDOMEN PELVIS W CONTRAST Result Date: 07/12/2023 CLINICAL DATA:  Right-sided abdominal pain for 3 weeks, constipation EXAM: CT ABDOMEN AND PELVIS WITH CONTRAST TECHNIQUE: Multidetector CT imaging of the abdomen and pelvis was performed using the standard protocol following bolus administration of intravenous contrast. RADIATION DOSE REDUCTION: This exam was performed according to the departmental dose-optimization program which includes  automated exposure control, adjustment of the mA and/or kV according to patient size and/or use of iterative reconstruction technique. CONTRAST:  OMNIPAQUE IOHEXOL 300 MG/ML  SOLN COMPARISON:  01/12/2020 FINDINGS: Lower chest: Scattered areas of bibasilar subsegmental atelectasis or scarring. No acute pleural or parenchymal lung disease. Chronic elevation the  right hemidiaphragm. Hepatobiliary: No focal liver abnormality is seen. Status post cholecystectomy. No biliary dilatation. Pancreas: Unremarkable. No pancreatic ductal dilatation or surrounding inflammatory changes. Spleen: Normal in size without focal abnormality. Adrenals/Urinary Tract: Bilateral renal cortical cysts do not require specific imaging follow-up. Otherwise the kidneys enhance normally and symmetrically. No urinary tract calculi or obstruction. The adrenals and bladder are unremarkable. Stomach/Bowel: Postsurgical changes are seen from distal gastrectomy and gastrojejunostomy. There is a dilated segment of mid to distal jejunum within the lower abdomen, measuring up to 3.4 cm in diameter, with scattered gas fluid levels. This could reflect segmental ileus or developing obstruction. Distal small bowel is decompressed. There is no abrupt transition point. Continued radiographic follow-up is recommended. Normal appendix right lower quadrant. Minimal gas and stool throughout the colon. No bowel wall thickening or inflammatory change. Vascular/Lymphatic: Aortic atherosclerosis. No enlarged abdominal or pelvic lymph nodes. Reproductive: Prostate is enlarged but stable. Other: No free fluid or free intraperitoneal gas. Fat containing left inguinal hernia. No bowel herniation. Musculoskeletal: No acute or destructive bony abnormalities. Reconstructed images demonstrate no additional findings. IMPRESSION: 1. Dilated segment of mid jejunum measuring up to 3.4 cm, which may reflect developing obstruction or localized ileus. Continued radiographic  follow-up is recommended. 2.  Aortic Atherosclerosis (ICD10-I70.0). 3. Stable enlarged prostate. 4. Stable fat containing left inguinal hernia. Electronically Signed   By: Sharlet Salina M.D.   On: 07/12/2023 14:38    Pending Labs Unresulted Labs (From admission, onward)     Start     Ordered   07/19/23 0500  Creatinine, serum  (enoxaparin (LOVENOX)    CrCl >/= 30 ml/min)  Weekly,   R     Comments: while on enoxaparin therapy    07/12/23 1655   07/13/23 0500  APTT  Tomorrow morning,   R        07/12/23 1655   07/13/23 0500  Protime-INR  Tomorrow morning,   R        07/12/23 1655   07/13/23 0500  Basic metabolic panel  Tomorrow morning,   R        07/12/23 1655   07/13/23 0500  CBC  Tomorrow morning,   R        07/12/23 1655   07/12/23 1654  CBC  (enoxaparin (LOVENOX)    CrCl >/= 30 ml/min)  Once,   R       Comments: Baseline for enoxaparin therapy IF NOT ALREADY DRAWN.  Notify MD if PLT < 100 K.    07/12/23 1655   07/12/23 1654  Creatinine, serum  (enoxaparin (LOVENOX)    CrCl >/= 30 ml/min)  Once,   R       Comments: Baseline for enoxaparin therapy IF NOT ALREADY DRAWN.    07/12/23 1655   07/12/23 1250  Urine Culture  Once,   R        07/12/23 1250   07/12/23 1008  CBC with Differential/Platelet  Once,   STAT        07/12/23 1007            Vitals/Pain Today's Vitals   07/12/23 1452 07/12/23 1537 07/12/23 1540 07/12/23 1700  BP:  (!) 166/108  (!) 153/95  Pulse:  73  69  Resp:  18  18  Temp: 98.5 F (36.9 C)     TempSrc: Oral     SpO2:  98%  98%  Weight:      Height:      PainSc:  4      Isolation Precautions No active isolations  Medications Medications  cefTRIAXone (ROCEPHIN) 1 g in sodium chloride 0.9 % 100 mL IVPB (0 g Intravenous Stopped 07/12/23 1540)  acetaminophen (TYLENOL) tablet 650 mg (has no administration in time range)    Or  acetaminophen (TYLENOL) suppository 650 mg (has no administration in time range)  polyethylene glycol (MIRALAX /  GLYCOLAX) packet 17 g (has no administration in time range)  sodium chloride flush (NS) 0.9 % injection 3 mL (has no administration in time range)  dextrose 5% in lactated ringers with KCl 20 mEq/L infusion (has no administration in time range)  enoxaparin (LOVENOX) injection 60 mg (has no administration in time range)  senna-docusate (Senokot-S) tablet 2 tablet (has no administration in time range)  labetalol (NORMODYNE) injection 20 mg (has no administration in time range)  hydrALAZINE (APRESOLINE) injection 10 mg (has no administration in time range)  pantoprazole (PROTONIX) injection 40 mg (has no administration in time range)  celecoxib (CELEBREX) capsule 200 mg (has no administration in time range)  ondansetron (ZOFRAN) injection 4 mg (has no administration in time range)  traMADol (ULTRAM) tablet 50 mg (has no administration in time range)  lisinopril (ZESTRIL) tablet 10 mg (has no administration in time range)  amLODipine (NORVASC) tablet 10 mg (has no administration in time range)  diclofenac Sodium (VOLTAREN) 1 % topical gel 4 g (has no administration in time range)  hydrochlorothiazide (HYDRODIURIL) tablet 25 mg (has no administration in time range)  finasteride (PROSCAR) tablet 5 mg (has no administration in time range)  Cholecalciferol TABS 2,000 Units (has no administration in time range)  sodium chloride 0.9 % bolus 1,000 mL (0 mLs Intravenous Stopped 07/12/23 1613)  ondansetron (ZOFRAN) injection 4 mg (4 mg Intravenous Given 07/12/23 1127)  iohexol (OMNIPAQUE) 300 MG/ML solution 100 mL (100 mLs Intravenous Contrast Given 07/12/23 1356)  morphine (PF) 4 MG/ML injection 6 mg (6 mg Intravenous Given 07/12/23 1509)    Mobility walks     Focused Assessments     R Recommendations: See Admitting Provider Note  Report given to:   Additional Notes:

## 2023-07-12 NOTE — H&P (Addendum)
History and Physical    Patient: David Nunez ZOX:[id number] DOB: 01-25-1946 DOA: 07/12/2023 DOS: the patient was seen and examined on 07/12/2023 PCP: [facility]  Patient coming from: Home  Chief Complaint:  Chief Complaint  Patient presents with   Abdominal Pain   HPI: David Nunez is a 77 y.o. male with medical history significant of laparoscopy with open cholecystectomy approximately a year ago.  Patient also reports chronic longstanding constipation for about the same time for which he uses laxatives.  Patient reports being in his usual state of health till approximately 3 weeks ago when he reports a new onset of right sided mild sharp pain that was present pretty much throughout the day but especially worse when he would have a bowel movement and then slowly subside afterwards.  Patient denies any fever denies any diarrhea.  Patient reports that the pain has slowly worsened over the last 3 weeks.  Patient reports yesterday for dinner he regurgitated some food that he had bought from outside a few days ago and had that.  Subsequently he recalls having 4 episodes of vomiting overnight.  Patient got to our [facility] long ER at approximately 11 AM this morning.  Patient has had no further episodes of vomiting.  Patient's abdominal pain is well-controlled no fevers again no diarrhea.  There is no bloody emesis.  Patient does not recall similar separate symptoms in the past 1 year.  Patient has since then been given clear liquids to drink and is feeling pretty good about it. Patient is passing gas, definitely per pateint.  Medical admission is sought. Review of Systems: As mentioned in the history of present illness. All other systems reviewed and are negative. Past Medical History:  Diagnosis Date   Anemia    BPH (benign prostatic hyperplasia)    Cataracts, bilateral    Elevated LFTs    Hypertension    Past Surgical History:  Procedure Laterality Date   cataract surgery  Right    CHOLECYSTECTOMY N/A 01/14/2020   Procedure: DIAGNOSTIC LAPAROSCOPY WITH OPEN CHOLECYSTECTOMY AND INTRAOPERATIVE CHOLANGIOGRAM;  Surgeon: Darnell Level, MD;  Location: [facility];  Service: General;  Laterality: N/A;   gastric ulcer surgery     REPLACEMENT TOTAL KNEE Bilateral    Social History:  reports that he has never smoked. He has never used smokeless tobacco. He reports that he does not drink alcohol and does not use drugs.  No Known Allergies  Family History  Problem Relation Age of Onset   Cancer Mother        ?   Colon cancer Neg Hx    Esophageal cancer Neg Hx    Inflammatory bowel disease Neg Hx    Liver disease Neg Hx    Pancreatic cancer Neg Hx    Rectal cancer Neg Hx    Stomach cancer Neg Hx     Prior to Admission medications   Medication Sig Start Date End Date Taking? Authorizing Provider  ADVIL 200 MG CAPS Take 200 mg by mouth every 6 (six) hours as needed (for mild pain or headaches).   Yes [provider]  amLODipine (NORVASC) 10 MG tablet Take 10 mg by mouth daily.   Yes [provider]  Cholecalciferol 50 MCG (2000 UT) TABS Take 2,000 Units by mouth daily.   Yes [provider]  cyanocobalamin 500 MCG tablet Take 500 mcg by mouth daily.   Yes [provider]  diclofenac Sodium (VOLTAREN ARTHRITIS PAIN) 1 % GEL Apply  4 g topically 3 (three) times daily as needed (for pain- bilateral knees).   Yes [provider]  ferrous sulfate 325 (65 FE) MG tablet Take 325 mg by mouth every Monday, Wednesday, and Friday.   Yes [provider]  finasteride (PROSCAR) 5 MG tablet Take 5 mg by mouth daily.   Yes [provider]  fluticasone (FLONASE) 50 MCG/ACT nasal spray Place 1 spray into both nostrils 2 (two) times daily as needed for rhinitis or allergies.   Yes [provider]  hydrochlorothiazide (HYDRODIURIL) 25 MG tablet Take 25 mg by mouth See admin instructions. Take 25 mg by mouth one to two times  a week 09/09/20  Yes [provider]  lisinopril (ZESTRIL) 10 MG tablet Take 10 mg by mouth daily.   Yes [provider]  naloxone (NARCAN) nasal spray 4 mg/0.1 mL Place 1 spray into the nose See admin instructions. INSTILL 1 SPRAY INTO ONE NOSTRIL AS DIRECTED FOR OPIOID OVERDOSE - CALL 9-1-1 IMMEDIATELY, ADMINISTER DOSE, THEN TURN PERSON ON SIDE - IF NO RESPONSE IN 2-3 MINUTES OR PERSON RESPONDS BUT RELAPSES, REPEAT USING A NEW SPRAY DEVICE AND SPRAY INTO THE OTHER NOSTRIL   Yes [provider]  omeprazole (PRILOSEC) 20 MG capsule Take 20 mg by mouth daily before breakfast.   Yes [provider]  polyethylene glycol powder (GLYCOLAX/MIRALAX) 17 GM/SCOOP powder Take 17 g by mouth daily as needed for mild constipation (mix as directed).   Yes [provider]  traMADol (ULTRAM) 50 MG tablet Take 50 mg by mouth 2 (two) times daily as needed (for knee pain). 04/22/16  Yes [provider]  benzonatate (TESSALON) 100 MG capsule Take 1 capsule (100 mg total) by mouth every 8 (eight) hours. Patient not taking: Reported on 07/12/2023 03/21/22   Saddie Benders, [place]    Physical Exam: Vitals:   07/12/23 1244 07/12/23 1245 07/12/23 1452 07/12/23 1537  BP: (!) 169/102 (!) 169/102  (!) 166/108  Pulse: 90 86  73  Resp: 18 18  18   Temp:   98.5 F (36.9 C)   TempSrc:   Oral   SpO2: 97% 93%  98%  Weight:      Height:       General: Obese appearing gentleman appears to be no immediate distress. Respiratory exam: Bilateral intravesicular Cardiovascular exam S1-S2 normal Abdomen: Bowel sounds are diminished.  All quadrants are soft, patient reports right upper mid and lower area tenderness most localized around the lateral aspect on the right side of the umbilicus.  No guarding or rebound. Extremities warm without edema no focal motor deficit Wife at the bedside. Data Reviewed:  Labs on Admission:  Results for orders placed or performed during the  hospital encounter of 07/12/23 (from the past 24 hours)  CBC with Differential/Platelet     Status: Abnormal   Collection Time: 07/12/23 11:55 AM  Result Value Ref Range   WBC 9.7 4.0 - 10.5 K/uL   RBC 5.30 4.22 - 5.81 MIL/uL   Hemoglobin 14.5 13.0 - 17.0 g/dL   HCT 16.1 09.6 - 04.5 %   MCV 83.8 80.0 - 100.0 fL   MCH 27.4 26.0 - 34.0 pg   MCHC 32.7 30.0 - 36.0 g/dL   RDW 40.9 81.1 - 91.4 %   Platelets 214 150 - 400 K/uL   nRBC 0.0 0.0 - 0.2 %   Neutrophils Relative % 83 %   Neutro Abs 8.1 (H) 1.7 - 7.7 K/uL   Lymphocytes  Relative 11 %   Lymphs Abs 1.0 0.7 - 4.0 K/uL   Monocytes Relative 5 %   Monocytes Absolute 0.5 0.1 - 1.0 K/uL   Eosinophils Relative 1 %   Eosinophils Absolute 0.1 0.0 - 0.5 K/uL   Basophils Relative 0 %   Basophils Absolute 0.0 0.0 - 0.1 K/uL   Immature Granulocytes 0 %   Abs Immature Granulocytes 0.04 0.00 - 0.07 K/uL  Comprehensive metabolic panel     Status: Abnormal   Collection Time: 07/12/23 12:30 PM  Result Value Ref Range   Sodium 133 (L) 135 - 145 mmol/L   Potassium 4.3 3.5 - 5.1 mmol/L   Chloride 100 98 - 111 mmol/L   CO2 26 22 - 32 mmol/L   Glucose, Bld 100 (H) 70 - 99 mg/dL   BUN 10 8 - 23 mg/dL   Creatinine, Ser 9.62 0.61 - 1.24 mg/dL   Calcium 9.5 8.9 - 95.2 mg/dL   Total Protein 7.2 6.5 - 8.1 g/dL   Albumin 3.8 3.5 - 5.0 g/dL   AST 14 (L) 15 - 41 U/L   ALT 15 0 - 44 U/L   Alkaline Phosphatase 85 38 - 126 U/L   Total Bilirubin 1.2 (H) <1.2 mg/dL   GFR, Estimated >84 >13 mL/min   Anion gap 7 5 - 15  Lipase, blood     Status: None   Collection Time: 07/12/23 12:30 PM  Result Value Ref Range   Lipase 24 11 - 51 U/L  Urinalysis, w/ Reflex to Culture (Infection Suspected) -Urine, Clean Catch     Status: Abnormal   Collection Time: 07/12/23 12:50 PM  Result Value Ref Range   Specimen Source URINE, CLEAN CATCH    Color, Urine YELLOW YELLOW   APPearance CLEAR CLEAR   Specific Gravity, Urine 1.011 1.005 - 1.030   pH 8.0 5.0 - 8.0    Glucose, UA NEGATIVE NEGATIVE mg/dL   Hgb urine dipstick NEGATIVE NEGATIVE   Bilirubin Urine NEGATIVE NEGATIVE   Ketones, ur NEGATIVE NEGATIVE mg/dL   Protein, ur NEGATIVE NEGATIVE mg/dL   Nitrite NEGATIVE NEGATIVE   Leukocytes,Ua LARGE (A) NEGATIVE   RBC / HPF 6-10 0 - 5 RBC/hpf   WBC, UA 21-50 0 - 5 WBC/hpf   Bacteria, UA NONE SEEN NONE SEEN   Squamous Epithelial / HPF 0-5 0 - 5 /HPF   Basic Metabolic Panel: Recent Labs  Lab 07/12/23 1230  NA 133*  K 4.3  CL 100  CO2 26  GLUCOSE 100*  BUN 10  CREATININE 0.87  CALCIUM 9.5   Liver Function Tests: Recent Labs  Lab 07/12/23 1230  AST 14*  ALT 15  ALKPHOS 85  BILITOT 1.2*  PROT 7.2  ALBUMIN 3.8   Recent Labs  Lab 07/12/23 1230  LIPASE 24   No results for input(s): "AMMONIA" in the last 168 hours. CBC: Recent Labs  Lab 07/12/23 1155  WBC 9.7  NEUTROABS 8.1*  HGB 14.5  HCT 44.4  MCV 83.8  PLT 214   Cardiac Enzymes: No results for input(s): "CKTOTAL", "CKMB", "CKMBINDEX", "TROPONINIHS" in the last 168 hours.  BNP (last 3 results) No results for input(s): "PROBNP" in the last 8760 hours. CBG: No results for input(s): "GLUCAP" in the last 168 hours.  Radiological Exams on Admission:  CT ABDOMEN PELVIS W CONTRAST Result Date: 07/12/2023 CLINICAL DATA:  Right-sided abdominal pain for 3 weeks, constipation EXAM: CT ABDOMEN AND PELVIS WITH CONTRAST TECHNIQUE: Multidetector CT imaging of the abdomen and  pelvis was performed using the standard protocol following bolus administration of intravenous contrast. RADIATION DOSE REDUCTION: This exam was performed according to the departmental dose-optimization program which includes automated exposure control, adjustment of the mA and/or kV according to patient size and/or use of iterative reconstruction technique. CONTRAST:  OMNIPAQUE IOHEXOL 300 MG/ML  SOLN COMPARISON:  01/12/2020 FINDINGS: Lower chest: Scattered areas of bibasilar subsegmental atelectasis or  scarring. No acute pleural or parenchymal lung disease. Chronic elevation the right hemidiaphragm. Hepatobiliary: No focal liver abnormality is seen. Status post cholecystectomy. No biliary dilatation. Pancreas: Unremarkable. No pancreatic ductal dilatation or surrounding inflammatory changes. Spleen: Normal in size without focal abnormality. Adrenals/Urinary Tract: Bilateral renal cortical cysts do not require specific imaging follow-up. Otherwise the kidneys enhance normally and symmetrically. No urinary tract calculi or obstruction. The adrenals and bladder are unremarkable. Stomach/Bowel: Postsurgical changes are seen from distal gastrectomy and gastrojejunostomy. There is a dilated segment of mid to distal jejunum within the lower abdomen, measuring up to 3.4 cm in diameter, with scattered gas fluid levels. This could reflect segmental ileus or developing obstruction. Distal small bowel is decompressed. There is no abrupt transition point. Continued radiographic follow-up is recommended. Normal appendix right lower quadrant. Minimal gas and stool throughout the colon. No bowel wall thickening or inflammatory change. Vascular/Lymphatic: Aortic atherosclerosis. No enlarged abdominal or pelvic lymph nodes. Reproductive: Prostate is enlarged but stable. Other: No free fluid or free intraperitoneal gas. Fat containing left inguinal hernia. No bowel herniation. Musculoskeletal: No acute or destructive bony abnormalities. Reconstructed images demonstrate no additional findings. IMPRESSION: 1. Dilated segment of mid jejunum measuring up to 3.4 cm, which may reflect developing obstruction or localized ileus. Continued radiographic follow-up is recommended. 2.  Aortic Atherosclerosis (ICD10-I70.0). 3. Stable enlarged prostate. 4. Stable fat containing left inguinal hernia. Electronically Signed   By: Sharlet Salina M.D.   On: 07/12/2023 14:38       Assessment and Plan: * Vomiting This likely represents GI  upset/gastritis likely due to patient having had previously cooked reheated food last evening.  Patient looking nontoxic at this time.  Reviewed CAT scan findings as above, I suspect jejunal dilation is likely due to fluid versus peristaltic movement.  Clinically given that the patient is passing gas, having sluggish bowel sounds, not felt to be true obstruction at this time.  Patient is already tolerating clears which will be continued and escalated as patient is able to tolerate.  Will get a troponin level. Pantoprazole and DVT ppx ordered  UTI (urinary tract infection) Incidentally patient noted to have urine leukocytes, symptomatology as above in HPI, felt to be nonspecific for UTI.  Patient started on ceftriaxone in ER.  Complete 3-day course.  Urine cultures pending  Abdominal pain See HPI, going on for about 3 to 4 weeks, exacerbated by straining or, patient reported lip reported to me later also by coughing today.  Felt to be musculoskeletal based on above.  I will treat with laxatives and pain medications.      Advance Care Planning:   Code Status: Full Code   Consults: none at this time.  Family Communication: wife at bedside. Careplan as above discussed in deetail. CT finding and interpretation discussed as well.  Severity of Illness: The appropriate patient status for this patient is OBSERVATION. Observation status is judged to be reasonable and necessary in order to provide the required intensity of service to ensure the patient's safety. The patient's presenting symptoms, physical exam findings, and initial radiographic and laboratory  data in the context of their medical condition is felt to place them at decreased risk for further clinical deterioration. Furthermore, it is anticipated that the patient will be medically stable for discharge from the hospital within 2 midnights of admission.   Author: Nolberto Hanlon, MD 07/12/2023 5:05 PM  For on call review [email or web address].

## 2023-07-12 NOTE — ED Triage Notes (Signed)
Pt reports right side abdominal pain x3 weeks. States last night started vomiting. Reports has been constipated as well. No urinary changes.

## 2023-07-12 NOTE — Assessment & Plan Note (Signed)
Incidentally patient noted to have urine leukocytes, symptomatology as above in HPI, felt to be nonspecific for UTI.  Patient started on ceftriaxone in ER.  Complete 3-day course.  Urine cultures pending

## 2023-07-12 NOTE — ED Notes (Signed)
Light green and purple tubes resent to lab

## 2023-07-13 ENCOUNTER — Inpatient Hospital Stay (HOSPITAL_COMMUNITY): Payer: No Typology Code available for payment source

## 2023-07-13 DIAGNOSIS — R112 Nausea with vomiting, unspecified: Secondary | ICD-10-CM | POA: Diagnosis not present

## 2023-07-13 DIAGNOSIS — N400 Enlarged prostate without lower urinary tract symptoms: Secondary | ICD-10-CM | POA: Diagnosis present

## 2023-07-13 DIAGNOSIS — I10 Essential (primary) hypertension: Secondary | ICD-10-CM | POA: Diagnosis present

## 2023-07-13 DIAGNOSIS — Z6841 Body Mass Index (BMI) 40.0 and over, adult: Secondary | ICD-10-CM | POA: Diagnosis not present

## 2023-07-13 DIAGNOSIS — Z96653 Presence of artificial knee joint, bilateral: Secondary | ICD-10-CM | POA: Diagnosis present

## 2023-07-13 DIAGNOSIS — Z9049 Acquired absence of other specified parts of digestive tract: Secondary | ICD-10-CM | POA: Diagnosis not present

## 2023-07-13 DIAGNOSIS — R109 Unspecified abdominal pain: Secondary | ICD-10-CM

## 2023-07-13 DIAGNOSIS — Z79899 Other long term (current) drug therapy: Secondary | ICD-10-CM | POA: Diagnosis not present

## 2023-07-13 DIAGNOSIS — Z8711 Personal history of peptic ulcer disease: Secondary | ICD-10-CM | POA: Diagnosis not present

## 2023-07-13 DIAGNOSIS — K5651 Intestinal adhesions [bands], with partial obstruction: Secondary | ICD-10-CM | POA: Diagnosis present

## 2023-07-13 DIAGNOSIS — R1011 Right upper quadrant pain: Secondary | ICD-10-CM | POA: Diagnosis not present

## 2023-07-13 DIAGNOSIS — K56609 Unspecified intestinal obstruction, unspecified as to partial versus complete obstruction: Secondary | ICD-10-CM | POA: Diagnosis not present

## 2023-07-13 DIAGNOSIS — K5909 Other constipation: Secondary | ICD-10-CM | POA: Diagnosis present

## 2023-07-13 DIAGNOSIS — E669 Obesity, unspecified: Secondary | ICD-10-CM | POA: Diagnosis present

## 2023-07-13 LAB — BASIC METABOLIC PANEL
Anion gap: 6 (ref 5–15)
BUN: 7 mg/dL — ABNORMAL LOW (ref 8–23)
CO2: 23 mmol/L (ref 22–32)
Calcium: 9.3 mg/dL (ref 8.9–10.3)
Chloride: 106 mmol/L (ref 98–111)
Creatinine, Ser: 0.78 mg/dL (ref 0.61–1.24)
GFR, Estimated: >60 mL/min (ref >60)
Glucose, Bld: 128 mg/dL — ABNORMAL HIGH (ref 70–99)
Potassium: 4.2 mmol/L (ref 3.5–5.1)
Sodium: 135 mmol/L (ref 135–145)

## 2023-07-13 LAB — URINE CULTURE

## 2023-07-13 LAB — CBC
HCT: 41.8 % (ref 39.0–52.0)
Hemoglobin: 13.6 g/dL (ref 13.0–17.0)
MCH: 27.8 pg (ref 26.0–34.0)
MCHC: 32.5 g/dL (ref 30.0–36.0)
MCV: 85.5 fL (ref 80.0–100.0)
Platelets: 181 10*3/uL (ref 150–400)
RBC: 4.89 MIL/uL (ref 4.22–5.81)
RDW: 13.9 % (ref 11.5–15.5)
WBC: 9.9 10*3/uL (ref 4.0–10.5)
nRBC: 0 % (ref 0.0–0.2)

## 2023-07-13 LAB — APTT: aPTT: 29 s (ref 24–36)

## 2023-07-13 LAB — PROTIME-INR
INR: 1.1 (ref 0.8–1.2)
Prothrombin Time: 14.3 s (ref 11.4–15.2)

## 2023-07-13 LAB — MAGNESIUM: Magnesium: 2 mg/dL (ref 1.7–2.4)

## 2023-07-13 MED ORDER — MENTHOL 3 MG MT LOZG
1.0000 | LOZENGE | OROMUCOSAL | Status: DC | PRN
  Administered 2023-07-13: 3 mg via ORAL
  Filled 2023-07-13: qty 9

## 2023-07-13 MED ORDER — DIATRIZOATE MEGLUMINE & SODIUM 66-10 % PO SOLN
90.0000 mL | Freq: Once | ORAL | Status: AC
  Administered 2023-07-13: 90 mL via NASOGASTRIC
  Filled 2023-07-13: qty 90

## 2023-07-13 MED ORDER — BISACODYL 10 MG RE SUPP
10.0000 mg | Freq: Once | RECTAL | Status: AC
  Administered 2023-07-13: 10 mg via RECTAL
  Filled 2023-07-13: qty 1

## 2023-07-13 NOTE — Plan of Care (Signed)
  Problem: Health Behavior/Discharge Planning: Goal: Ability to manage health-related needs will improve Outcome: Progressing   

## 2023-07-13 NOTE — Consult Note (Signed)
Reason for Consult: SBO Referring Physician: Dr. Quenten Raven is an 77 y.o. male.  HPI: Patient is a 77 year old male with a history of hypertension, who comes in with a 3-week history of abdominal pain.  He states that he [place] abdominal pain that was in the lower portion of his abdomen.  He states he has had recent nausea vomiting that brought him to the ER.  He denies any hematemesis.  He states he had no diarrhea.  Upon evaluation in the ER he underwent CT scan.  This was significant for possible localized ileus versus developing SBO.  There are some dilated loops of small bowel.  These were fluid-filled.  Patient with no leukocytosis.  I did review the CT scan personally.  Of note patient had a previous open cholecystectomy with a right Kocher incision and an ex lap for gastric ulcer surgery in the remote past.  Past Medical History:  Diagnosis Date   Anemia    BPH (benign prostatic hyperplasia)    Cataracts, bilateral    Elevated LFTs    Hypertension     Past Surgical History:  Procedure Laterality Date   cataract surgery Right    CHOLECYSTECTOMY N/A 01/14/2020   Procedure: DIAGNOSTIC LAPAROSCOPY WITH OPEN CHOLECYSTECTOMY AND INTRAOPERATIVE CHOLANGIOGRAM;  Surgeon: Darnell Level, MD;  Location: [facility];  Service: General;  Laterality: N/A;   gastric ulcer surgery     REPLACEMENT TOTAL KNEE Bilateral     Family History  Problem Relation Age of Onset   Cancer Mother        ?   Colon cancer Neg Hx    Esophageal cancer Neg Hx    Inflammatory bowel disease Neg Hx    Liver disease Neg Hx    Pancreatic cancer Neg Hx    Rectal cancer Neg Hx    Stomach cancer Neg Hx     Social History:  reports that he has never smoked. He has never used smokeless tobacco. He reports that he does not drink alcohol and does not use drugs.  Allergies: No Known Allergies  Medications: I have reviewed the patient's current medications.  Results for orders placed or performed during the  hospital encounter of 07/12/23 (from the past 48 hours)  CBC with Differential/Platelet     Status: Abnormal   Collection Time: 07/12/23 11:55 AM  Result Value Ref Range   WBC 9.7 4.0 - 10.5 K/uL   RBC 5.30 4.22 - 5.81 MIL/uL   Hemoglobin 14.5 13.0 - 17.0 g/dL   HCT 41.3 24.4 - 01.0 %   MCV 83.8 80.0 - 100.0 fL   MCH 27.4 26.0 - 34.0 pg   MCHC 32.7 30.0 - 36.0 g/dL   RDW 27.2 53.6 - 64.4 %   Platelets 214 150 - 400 K/uL   nRBC 0.0 0.0 - 0.2 %   Neutrophils Relative % 83 %   Neutro Abs 8.1 (H) 1.7 - 7.7 K/uL   Lymphocytes Relative 11 %   Lymphs Abs 1.0 0.7 - 4.0 K/uL   Monocytes Relative 5 %   Monocytes Absolute 0.5 0.1 - 1.0 K/uL   Eosinophils Relative 1 %   Eosinophils Absolute 0.1 0.0 - 0.5 K/uL   Basophils Relative 0 %   Basophils Absolute 0.0 0.0 - 0.1 K/uL   Immature Granulocytes 0 %   Abs Immature Granulocytes 0.04 0.00 - 0.07 K/uL    Comment: Performed at [facility], [address] [place], [place] [address]  Comprehensive  metabolic panel     Status: Abnormal   Collection Time: 07/12/23 12:30 PM  Result Value Ref Range   Sodium 133 (L) 135 - 145 mmol/L   Potassium 4.3 3.5 - 5.1 mmol/L   Chloride 100 98 - 111 mmol/L   CO2 26 22 - 32 mmol/L   Glucose, Bld 100 (H) 70 - 99 mg/dL    Comment: Glucose reference range applies only to samples taken after fasting for at least 8 hours.   BUN 10 8 - 23 mg/dL   Creatinine, Ser 2.95 0.61 - 1.24 mg/dL   Calcium 9.5 8.9 - 28.4 mg/dL   Total Protein 7.2 6.5 - 8.1 g/dL   Albumin 3.8 3.5 - 5.0 g/dL   AST 14 (L) 15 - 41 U/L   ALT 15 0 - 44 U/L   Alkaline Phosphatase 85 38 - 126 U/L   Total Bilirubin 1.2 (H) <1.2 mg/dL   GFR, Estimated >13 >24 mL/min    Comment: (NOTE) Calculated using the CKD-EPI Creatinine Equation (2021)    Anion gap 7 5 - 15    Comment: Performed at [facility], [address] [place], [place] [address]  Lipase, blood     Status: None   Collection Time: 07/12/23  12:30 PM  Result Value Ref Range   Lipase 24 11 - 51 U/L    Comment: Performed at [facility], [address], [place], [place] [address]  Urinalysis, w/ Reflex to Culture (Infection Suspected) -Urine, Clean Catch     Status: Abnormal   Collection Time: 07/12/23 12:50 PM  Result Value Ref Range   Specimen Source URINE, CLEAN CATCH    Color, Urine YELLOW YELLOW   APPearance CLEAR CLEAR   Specific Gravity, Urine 1.011 1.005 - 1.030   pH 8.0 5.0 - 8.0   Glucose, UA NEGATIVE NEGATIVE mg/dL   Hgb urine dipstick NEGATIVE NEGATIVE   Bilirubin Urine NEGATIVE NEGATIVE   Ketones, ur NEGATIVE NEGATIVE mg/dL   Protein, ur NEGATIVE NEGATIVE mg/dL   Nitrite NEGATIVE NEGATIVE   Leukocytes,Ua LARGE (A) NEGATIVE   RBC / HPF 6-10 0 - 5 RBC/hpf   WBC, UA 21-50 0 - 5 WBC/hpf    Comment:        Reflex urine culture not performed if WBC <=10, OR if Squamous epithelial cells >5. If Squamous epithelial cells >5 suggest recollection.    Bacteria, UA NONE SEEN NONE SEEN   Squamous Epithelial / HPF 0-5 0 - 5 /HPF    Comment: Performed at [facility], [address] [place], [place] [address]  CBC     Status: None   Collection Time: 07/12/23  7:03 PM  Result Value Ref Range   WBC 10.5 4.0 - 10.5 K/uL   RBC 4.79 4.22 - 5.81 MIL/uL   Hemoglobin 13.3 13.0 - 17.0 g/dL   HCT 47.4 25.9 - 56.3 %   MCV 84.6 80.0 - 100.0 fL   MCH 27.8 26.0 - 34.0 pg   MCHC 32.8 30.0 - 36.0 g/dL   RDW 87.5 64.3 - 32.9 %   Platelets 185 150 - 400 K/uL   nRBC 0.0 0.0 - 0.2 %    Comment: Performed at [facility], [address] [place], [place] [address]  Creatinine, serum     Status: None   Collection Time: 07/12/23  7:03 PM  Result Value Ref Range   Creatinine, Ser 0.86 0.61 - 1.24 mg/dL   GFR, Estimated >  60 >60 mL/min    Comment: (NOTE) Calculated using the CKD-EPI Creatinine Equation (2021) Performed at [facility], [address] [place], [place] [address]   Troponin I (High Sensitivity)     Status: None   Collection Time: 07/12/23  7:03 PM  Result Value Ref Range   Troponin I (High Sensitivity) 7 <18 ng/L    Comment: (NOTE) Elevated high sensitivity troponin I (hsTnI) values and significant  changes across serial measurements may suggest ACS but many other  chronic and acute conditions are known to elevate hsTnI results.  Refer to the "Links" section for chest pain algorithms and additional  guidance. Performed at [facility], [address] [place], [place] [address]   APTT     Status: None   Collection Time: 07/13/23  4:28 AM  Result Value Ref Range   aPTT 29 24 - 36 seconds    Comment: Performed at [facility], [address] [place], [place] [address]  Protime-INR     Status: None   Collection Time: 07/13/23  4:28 AM  Result Value Ref Range   Prothrombin Time 14.3 11.4 - 15.2 seconds   INR 1.1 0.8 - 1.2    Comment: (NOTE) INR goal varies based on device and disease states. Performed at [facility], [address], [place], [place] [address]   Basic metabolic panel     Status: Abnormal   Collection Time: 07/13/23  4:28 AM  Result Value Ref Range   Sodium 135 135 - 145 mmol/L   Potassium 4.2 3.5 - 5.1 mmol/L   Chloride 106 98 - 111 mmol/L   CO2 23 22 - 32 mmol/L   Glucose, Bld 128 (H) 70 - 99 mg/dL    Comment: Glucose reference range applies only to samples taken after fasting for at least 8 hours.   BUN 7 (L) 8 - 23 mg/dL   Creatinine, Ser 8.10 0.61 - 1.24 mg/dL   Calcium 9.3 8.9 - 17.5 mg/dL   GFR, Estimated >10 >25 mL/min    Comment: (NOTE) Calculated using the CKD-EPI Creatinine Equation (2021)    Anion gap 6 5 - 15    Comment: Performed at [facility], [address], [place], [place] [address]  CBC     Status: None   Collection Time: 07/13/23  4:28 AM  Result Value Ref Range   WBC 9.9 4.0 - 10.5 K/uL    RBC 4.89 4.22 - 5.81 MIL/uL   Hemoglobin 13.6 13.0 - 17.0 g/dL   HCT 82.4 23.5 - 36.1 %   MCV 85.5 80.0 - 100.0 fL   MCH 27.8 26.0 - 34.0 pg   MCHC 32.5 30.0 - 36.0 g/dL   RDW 44.3 15.4 - 00.8 %   Platelets 181 150 - 400 K/uL   nRBC 0.0 0.0 - 0.2 %    Comment: Performed at [facility], [address] [place], [place] [address]  Magnesium     Status: None   Collection Time: 07/13/23  4:28 AM  Result Value Ref Range   Magnesium 2.0 1.7 - 2.4 mg/dL    Comment: Performed at [facility], [address], [place], [place] [address]    DG Abd 1 View Result Date: 07/12/2023 CLINICAL DATA:  [id number] Encounter for imaging study to confirm nasogastric (NG) tube placement [id number]. EXAM: ABDOMEN - 1 VIEW COMPARISON:  CT abdomen and pelvis 07/12/2023 FINDINGS: An enteric tube has  been placed with tip and side port projecting over the proximal stomach. Surgical clips are noted at the GE junction and in the right upper quadrant. No gross bowel dilatation is seen in the included portion of the abdomen. There is elevation of the right hemidiaphragm, likely with atelectasis in the lung bases. IMPRESSION: Enteric tube in the proximal stomach. Electronically Signed   By: Sebastian Ache M.D.   On: 07/12/2023 19:11   CT ABDOMEN PELVIS W CONTRAST Result Date: 07/12/2023 CLINICAL DATA:  Right-sided abdominal pain for 3 weeks, constipation EXAM: CT ABDOMEN AND PELVIS WITH CONTRAST TECHNIQUE: Multidetector CT imaging of the abdomen and pelvis was performed using the standard protocol following bolus administration of intravenous contrast. RADIATION DOSE REDUCTION: This exam was performed according to the departmental dose-optimization program which includes automated exposure control, adjustment of the mA and/or kV according to patient size and/or use of iterative reconstruction technique. CONTRAST:  OMNIPAQUE IOHEXOL 300 MG/ML  SOLN COMPARISON:  01/12/2020 FINDINGS: Lower  chest: Scattered areas of bibasilar subsegmental atelectasis or scarring. No acute pleural or parenchymal lung disease. Chronic elevation the right hemidiaphragm. Hepatobiliary: No focal liver abnormality is seen. Status post cholecystectomy. No biliary dilatation. Pancreas: Unremarkable. No pancreatic ductal dilatation or surrounding inflammatory changes. Spleen: Normal in size without focal abnormality. Adrenals/Urinary Tract: Bilateral renal cortical cysts do not require specific imaging follow-up. Otherwise the kidneys enhance normally and symmetrically. No urinary tract calculi or obstruction. The adrenals and bladder are unremarkable. Stomach/Bowel: Postsurgical changes are seen from distal gastrectomy and gastrojejunostomy. There is a dilated segment of mid to distal jejunum within the lower abdomen, measuring up to 3.4 cm in diameter, with scattered gas fluid levels. This could reflect segmental ileus or developing obstruction. Distal small bowel is decompressed. There is no abrupt transition point. Continued radiographic follow-up is recommended. Normal appendix right lower quadrant. Minimal gas and stool throughout the colon. No bowel wall thickening or inflammatory change. Vascular/Lymphatic: Aortic atherosclerosis. No enlarged abdominal or pelvic lymph nodes. Reproductive: Prostate is enlarged but stable. Other: No free fluid or free intraperitoneal gas. Fat containing left inguinal hernia. No bowel herniation. Musculoskeletal: No acute or destructive bony abnormalities. Reconstructed images demonstrate no additional findings. IMPRESSION: 1. Dilated segment of mid jejunum measuring up to 3.4 cm, which may reflect developing obstruction or localized ileus. Continued radiographic follow-up is recommended. 2.  Aortic Atherosclerosis (ICD10-I70.0). 3. Stable enlarged prostate. 4. Stable fat containing left inguinal hernia. Electronically Signed   By: Sharlet Salina M.D.   On: 07/12/2023 14:38    Review of  Systems  Constitutional:  Negative for chills and fever.  HENT:  Negative for ear discharge, hearing loss and sore throat.   Eyes:  Negative for discharge.  Respiratory:  Negative for cough and shortness of breath.   Cardiovascular:  Negative for chest pain and leg swelling.  Gastrointestinal:  Positive for abdominal pain, nausea and vomiting. Negative for constipation and diarrhea.  Musculoskeletal:  Negative for myalgias and neck pain.  Skin:  Negative for rash.  Allergic/Immunologic: Negative for environmental allergies.  Neurological:  Negative for dizziness and seizures.  Hematological:  Does not bruise/bleed easily.  Psychiatric/Behavioral:  Negative for suicidal ideas.   All other systems reviewed and are negative.  Blood pressure (!) 148/82, pulse 64, temperature 97.9 F (36.6 C), temperature source Axillary, resp. rate 19, height 5\' 8"  (1.727 m), weight 124.7 kg, SpO2 94%. Physical Exam Constitutional:      Appearance: He is well-developed.     Comments: Conversant  No acute distress  HENT:     Head: Normocephalic and atraumatic.  Eyes:     General: Lids are normal. No scleral icterus.    Pupils: Pupils are equal, round, and reactive to light.     Comments: Pupils are equal round and reactive No lid lag Moist conjunctiva  Neck:     Thyroid: No thyromegaly.     Trachea: No tracheal tenderness.     Comments: No cervical lymphadenopathy Cardiovascular:     Rate and Rhythm: Normal rate and regular rhythm.     Heart sounds: No murmur heard. Pulmonary:     Effort: Pulmonary effort is normal.     Breath sounds: Normal breath sounds. No wheezing or rales.  Abdominal:     General: There is distension.     Tenderness: There is no abdominal tenderness.     Hernia: No hernia is present.    Musculoskeletal:     Cervical back: Normal range of motion and neck supple.  Skin:    General: Skin is warm.     Findings: No rash.     Nails: There is no clubbing.     Comments:  Normal skin turgor  Neurological:     Mental Status: He is alert and oriented to person, place, and time.     Comments: Normal gait and station  Psychiatric:        Mood and Affect: Mood normal.        Thought Content: Thought content normal.        Judgment: Judgment normal.     Comments: Appropriate affect     Assessment/Plan: 77 year old male with ileus versus SBO HTN  1.  Will initiate SBO protocol.  Patient has NG tube in place. 2.  Discussed with him and his wife that if things do not seem open over the next 24 to 48 hours he may require surgery.  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 3.  Will continue to follow.  Axel Filler 07/13/2023, 8:09 AM

## 2023-07-13 NOTE — Progress Notes (Signed)
[facility]                                                                              David Nunez, is a 77 y.o. male, DOB - Feb 08, 1946, MWU:[id number] Admit date - 07/12/2023    Outpatient Primary MD for the patient is [facility]  LOS - 0  days  Chief Complaint  Patient presents with   Abdominal Pain       Brief summary   Patient is a 77 year old male with prior history of open cholecystectomy in 12/2019, prior gastric ulcer surgery in the past, chronic constipation, BPH, HTN presented to ED with new onset of right-sided abdominal pain, ongoing for last 3 weeks.  No fevers or chills, has chronic constipation.  Patient had 4 episodes of vomiting at home, then presented to ED.  CT abdomen showed bilateral segment of mid jejunum measuring up to 3.4 cm, developing obstruction or localized ileus. NGT was placed and patient was admitted for further workup  Assessment & Plan    Principal Problem:   SBO (small bowel obstruction) (HCC) -Presented with nausea vomiting, abdominal pain, prior history of surgery -NGT placed, continue n.p.o., IV fluids, pain control -General Surgery consulted -Dulcolax positive x 1  Active Problems:   Hypertension -Outpatient on amlodipine, HCTZ, lisinopril -Currently n.p.o., will place on IV hydralazine as needed with parameters     UTI (urinary tract infection) -UA with possible UTI, urine culture showed multiple species, patient was placed on IV Rocephin   BPH -Resume finasteride once taking p.o.  Obesity Estimated body mass index is 41.81 kg/m as calculated from the following:   Height as of this encounter: 5\' 8"  (1.727 m).   Weight as of this encounter: 124.7 kg.  Code Status: Full CODE STATUS DVT Prophylaxis:  SCDs Start: 07/12/23 1655   Level of Care: Level of care: Med-Surg Family Communication: Updated patient Disposition Plan:      Remains inpatient appropriate:      Procedures:   None  Consultants:   General Surgery  Antimicrobials:   Anti-infectives (From admission, onward)    Start     Dose/Rate Route Frequency Ordered Stop   07/12/23 1500  cefTRIAXone (ROCEPHIN) 1 g in sodium chloride 0.9 % 100 mL IVPB        1 g 200 mL/hr over 30 Minutes Intravenous Every 24 hours 07/12/23 1454            Medications  amLODipine  10 mg Oral Daily   celecoxib  200 mg Oral BID   cholecalciferol  2,000 Units Oral Daily   enoxaparin (LOVENOX) injection  60 mg Subcutaneous Q24H   finasteride  5 mg Oral Daily   [START ON 07/14/2023] hydrochlorothiazide  25 mg Oral Once per day on Monday Thursday   lisinopril  10 mg Oral Daily   pantoprazole (PROTONIX) IV  40 mg Intravenous Daily   senna-docusate  2 tablet Oral BID   sodium chloride flush  3 mL Intravenous Q12H      Subjective:   David Nunez was seen and examined today.  Abdominal pain, no ongoing nausea or vomiting, has NG tube+.  No BM since admission.  Some flatus+.  Patient denies dizziness, chest pain, shortness of breath.  No acute events overnight.    Objective:   Vitals:   07/12/23 2125 07/13/23 0126 07/13/23 0524 07/13/23 0953  BP: (!) 170/97 (!) 140/90 (!) 148/82 (!) 154/81  Pulse: 79 86 64 87  Resp: 18 19 19 18   Temp: 97.6 F (36.4 C) 98 F (36.7 C) 97.9 F (36.6 C) 98.2 F (36.8 C)  TempSrc: Oral Oral Axillary Oral  SpO2: 92% 95% 94% 95%  Weight:      Height:        Intake/Output Summary (Last 24 hours) at 07/13/2023 1251 Last data filed at 07/13/2023 0935 Gross per 24 hour  Intake 1489.61 ml  Output 1750 ml  Net -260.39 ml     Wt Readings from Last 3 Encounters:  07/12/23 124.7 kg  11/07/20 125.1 kg  09/24/20 126.6 kg     Exam General: Alert and oriented x 3, NAD Cardiovascular: S1 S2 auscultated,  RRR Respiratory: Clear to auscultation bilaterally, no wheezing, rales or rhonchi Gastrointestinal: Soft, diffuse mild TTP,+ distended, hypoactive BS  Ext: no pedal edema  bilaterally Neuro: no new deficits Psych: Normal affect     Data Reviewed:  I have personally reviewed following labs    CBC Lab Results  Component Value Date   WBC 9.9 07/13/2023   RBC 4.89 07/13/2023   HGB 13.6 07/13/2023   HCT 41.8 07/13/2023   MCV 85.5 07/13/2023   MCH 27.8 07/13/2023   PLT 181 07/13/2023   MCHC 32.5 07/13/2023   RDW 13.9 07/13/2023   LYMPHSABS 1.0 07/12/2023   MONOABS 0.5 07/12/2023   EOSABS 0.1 07/12/2023   BASOSABS 0.0 07/12/2023     Last metabolic panel Lab Results  Component Value Date   NA 135 07/13/2023   K 4.2 07/13/2023   CL 106 07/13/2023   CO2 23 07/13/2023   BUN 7 (L) 07/13/2023   CREATININE 0.78 07/13/2023   GLUCOSE 128 (H) 07/13/2023   GFRNONAA >60 07/13/2023   GFRAA >60 01/20/2020   CALCIUM 9.3 07/13/2023   PROT 7.2 07/12/2023   ALBUMIN 3.8 07/12/2023   BILITOT 1.2 (H) 07/12/2023   ALKPHOS 85 07/12/2023   AST 14 (L) 07/12/2023   ALT 15 07/12/2023   ANIONGAP 6 07/13/2023    CBG (last 3)  No results for input(s): "GLUCAP" in the last 72 hours.    Coagulation Profile: Recent Labs  Lab 07/13/23 0428  INR 1.1     Radiology Studies: I have personally reviewed the imaging studies  DG Abd 1 View Result Date: 07/12/2023 CLINICAL DATA:  [id number] Encounter for imaging study to confirm nasogastric (NG) tube placement [id number]. EXAM: ABDOMEN - 1 VIEW COMPARISON:  CT abdomen and pelvis 07/12/2023 FINDINGS: An enteric tube has been placed with tip and side port projecting over the proximal stomach. Surgical clips are noted at the GE junction and in the right upper quadrant. No gross bowel dilatation is seen in the included portion of the abdomen. There is elevation of the right hemidiaphragm, likely with atelectasis in the lung bases. IMPRESSION: Enteric tube in the proximal stomach. Electronically Signed   By: Sebastian Ache M.D.   On: 07/12/2023 19:11   CT ABDOMEN PELVIS W CONTRAST Result Date: 07/12/2023 CLINICAL DATA:   Right-sided abdominal pain for 3 weeks, constipation EXAM: CT ABDOMEN AND PELVIS WITH CONTRAST TECHNIQUE: Multidetector CT imaging of the abdomen and pelvis was performed using the standard protocol following bolus  administration of intravenous contrast. RADIATION DOSE REDUCTION: This exam was performed according to the departmental dose-optimization program which includes automated exposure control, adjustment of the mA and/or kV according to patient size and/or use of iterative reconstruction technique. CONTRAST:  OMNIPAQUE IOHEXOL 300 MG/ML  SOLN COMPARISON:  01/12/2020 FINDINGS: Lower chest: Scattered areas of bibasilar subsegmental atelectasis or scarring. No acute pleural or parenchymal lung disease. Chronic elevation the right hemidiaphragm. Hepatobiliary: No focal liver abnormality is seen. Status post cholecystectomy. No biliary dilatation. Pancreas: Unremarkable. No pancreatic ductal dilatation or surrounding inflammatory changes. Spleen: Normal in size without focal abnormality. Adrenals/Urinary Tract: Bilateral renal cortical cysts do not require specific imaging follow-up. Otherwise the kidneys enhance normally and symmetrically. No urinary tract calculi or obstruction. The adrenals and bladder are unremarkable. Stomach/Bowel: Postsurgical changes are seen from distal gastrectomy and gastrojejunostomy. There is a dilated segment of mid to distal jejunum within the lower abdomen, measuring up to 3.4 cm in diameter, with scattered gas fluid levels. This could reflect segmental ileus or developing obstruction. Distal small bowel is decompressed. There is no abrupt transition point. Continued radiographic follow-up is recommended. Normal appendix right lower quadrant. Minimal gas and stool throughout the colon. No bowel wall thickening or inflammatory change. Vascular/Lymphatic: Aortic atherosclerosis. No enlarged abdominal or pelvic lymph nodes. Reproductive: Prostate is enlarged but stable. Other:  No free fluid or free intraperitoneal gas. Fat containing left inguinal hernia. No bowel herniation. Musculoskeletal: No acute or destructive bony abnormalities. Reconstructed images demonstrate no additional findings. IMPRESSION: 1. Dilated segment of mid jejunum measuring up to 3.4 cm, which may reflect developing obstruction or localized ileus. Continued radiographic follow-up is recommended. 2.  Aortic Atherosclerosis (ICD10-I70.0). 3. Stable enlarged prostate. 4. Stable fat containing left inguinal hernia. Electronically Signed   By: Sharlet Salina M.D.   On: 07/12/2023 14:38       Castle Lamons M.D. [facility] 07/13/2023, 12:51 PM  Available via Epic secure chat 7am-7pm After 7 pm, please refer to night coverage provider listed on amion.

## 2023-07-13 NOTE — Progress Notes (Signed)
Patient stated throughout this shift " this tube is making my head hurt and my ears hurt, I want to to be taken out". Patient educated each time about the need for the NG tube. MD on call contacted to ask about discontinuing. No new orders. Prn medication given to to help with throat irritation. No reports of nausea or vomiting. Spouse at bedside. Plan of care ongoing.

## 2023-07-14 DIAGNOSIS — R109 Unspecified abdominal pain: Secondary | ICD-10-CM | POA: Diagnosis not present

## 2023-07-14 DIAGNOSIS — K56609 Unspecified intestinal obstruction, unspecified as to partial versus complete obstruction: Secondary | ICD-10-CM | POA: Diagnosis not present

## 2023-07-14 DIAGNOSIS — I10 Essential (primary) hypertension: Secondary | ICD-10-CM

## 2023-07-14 LAB — CBC
HCT: 44.4 % (ref 39.0–52.0)
Hemoglobin: 14.7 g/dL (ref 13.0–17.0)
MCH: 28.1 pg (ref 26.0–34.0)
MCHC: 33.1 g/dL (ref 30.0–36.0)
MCV: 84.9 fL (ref 80.0–100.0)
Platelets: 201 K/uL (ref 150–400)
RBC: 5.23 MIL/uL (ref 4.22–5.81)
RDW: 14.2 % (ref 11.5–15.5)
WBC: 9.4 K/uL (ref 4.0–10.5)
nRBC: 0 % (ref 0.0–0.2)

## 2023-07-14 LAB — BASIC METABOLIC PANEL
Anion gap: 6 (ref 5–15)
BUN: 5 mg/dL — ABNORMAL LOW (ref 8–23)
CO2: 23 mmol/L (ref 22–32)
Calcium: 9.8 mg/dL (ref 8.9–10.3)
Chloride: 108 mmol/L (ref 98–111)
Creatinine, Ser: 0.85 mg/dL (ref 0.61–1.24)
GFR, Estimated: >60 mL/min (ref >60)
Glucose, Bld: 107 mg/dL — ABNORMAL HIGH (ref 70–99)
Potassium: 3.9 mmol/L (ref 3.5–5.1)
Sodium: 137 mmol/L (ref 135–145)

## 2023-07-14 MED ORDER — POLYETHYLENE GLYCOL 3350 17 G PO PACK
17.0000 g | PACK | Freq: Every day | ORAL | Status: DC
  Administered 2023-07-14 – 2023-07-15 (×2): 17 g via ORAL
  Filled 2023-07-14 (×2): qty 1

## 2023-07-14 MED ORDER — AMLODIPINE BESYLATE 10 MG PO TABS
10.0000 mg | ORAL_TABLET | Freq: Every day | ORAL | Status: DC
  Administered 2023-07-14 – 2023-07-15 (×2): 10 mg via ORAL
  Filled 2023-07-14 (×2): qty 1

## 2023-07-14 MED ORDER — LISINOPRIL 10 MG PO TABS
10.0000 mg | ORAL_TABLET | Freq: Every day | ORAL | Status: DC
  Administered 2023-07-14 – 2023-07-15 (×2): 10 mg via ORAL
  Filled 2023-07-14 (×2): qty 1

## 2023-07-14 NOTE — Progress Notes (Signed)
[facility]                                                                              David Nunez, is a 77 y.o. male, DOB - 02-Apr-1946, MWU:[id number] Admit date - 07/12/2023    Outpatient Primary MD for the patient is [facility]  LOS - 1  days  Chief Complaint  Patient presents with   Abdominal Pain       Brief summary   Patient is a 77 year old male with prior history of open cholecystectomy in 12/2019, prior gastric ulcer surgery in the past, chronic constipation, BPH, HTN presented to ED with new onset of right-sided abdominal pain, ongoing for last 3 weeks.  No fevers or chills, has chronic constipation.  Patient had 4 episodes of vomiting at home, then presented to ED.  CT abdomen showed bilateral segment of mid jejunum measuring up to 3.4 cm, developing obstruction or localized ileus. NGT was placed and patient was admitted for further workup  Assessment & Plan    Principal Problem:   SBO (small bowel obstruction) (HCC) -Presented with nausea vomiting, abdominal pain, prior history of surgery -NGT removed, started on clear liquid diet -Sitting up in the chair, OOB, mobilize -Has history of constipation, placed on bowel regimen  Active Problems:   Hypertension -Outpatient on amlodipine, HCTZ, lisinopril -Started on clears today, will resume amlodipine, lisinopril.   -Continue IV hydralazine as needed with parameters   Suspected UTI (urinary tract infection) -UA with possible UTI,  urine culture showed multiple species -DC antibiotics, no urinary symptoms  BPH -Continue finasteride   Obesity  Estimated body mass index is 41.81 kg/m as calculated from the following:   Height as of this encounter: 5\' 8"  (1.727 m).   Weight as of this encounter: 124.7 kg.  Code Status: Full CODE STATUS DVT Prophylaxis:  SCDs Start: 07/12/23 1655   Level of Care: Level of care: Med-Surg Family Communication: Updated patient Disposition Plan:       Remains inpatient appropriate:   Hopefully DC home tomorrow if tolerating solid diet   Procedures:  None  Consultants:   General Surgery  Antimicrobials:   Anti-infectives (From admission, onward)    Start     Dose/Rate Route Frequency Ordered Stop   07/12/23 1500  cefTRIAXone (ROCEPHIN) 1 g in sodium chloride 0.9 % 100 mL IVPB        1 g 200 mL/hr over 30 Minutes Intravenous Every 24 hours 07/12/23 1454            Medications  cholecalciferol  2,000 Units Oral Daily   enoxaparin (LOVENOX) injection  60 mg Subcutaneous Q24H   finasteride  5 mg Oral Daily   pantoprazole (PROTONIX) IV  40 mg Intravenous Daily   senna-docusate  2 tablet Oral BID   sodium chloride flush  3 mL Intravenous Q12H      Subjective:   David Nunez was seen and examined today.  States had 2 BMs yesterday.  No significant abdominal pain, nausea or vomiting.  NG tube out.  No acute events overnight, chest pain or shortness of breath, fevers.    Objective:  Vitals:   07/13/23 1417 07/13/23 1600 07/13/23 2028 07/14/23 0618  BP: (!) 182/96 (!) 158/88 (!) 163/94 (!) 139/91  Pulse: 84 88 87 86  Resp: 18 16 18 18   Temp: 97.9 F (36.6 C)  98.6 F (37 C) 98.4 F (36.9 C)  TempSrc:   Oral Oral  SpO2: 96%  96% 95%  Weight:      Height:        Intake/Output Summary (Last 24 hours) at 07/14/2023 1321 Last data filed at 07/14/2023 1000 Gross per 24 hour  Intake 960 ml  Output 2 ml  Net 958 ml     Wt Readings from Last 3 Encounters:  07/12/23 124.7 kg  11/07/20 125.1 kg  09/24/20 126.6 kg   Physical Exam General: Alert and oriented x 3, NAD Cardiovascular: S1 S2 clear, RRR.  Respiratory: CTAB, no wheezing, rales or rhonchi Gastrointestinal: Soft, nontender, nondistended, NBS Ext: no pedal edema bilaterally Neuro: no new deficits Psych: Normal affect      Data Reviewed:  I have personally reviewed following labs    CBC Lab Results  Component Value Date   WBC 9.4  07/14/2023   RBC 5.23 07/14/2023   HGB 14.7 07/14/2023   HCT 44.4 07/14/2023   MCV 84.9 07/14/2023   MCH 28.1 07/14/2023   PLT 201 07/14/2023   MCHC 33.1 07/14/2023   RDW 14.2 07/14/2023   LYMPHSABS 1.0 07/12/2023   MONOABS 0.5 07/12/2023   EOSABS 0.1 07/12/2023   BASOSABS 0.0 07/12/2023     Last metabolic panel Lab Results  Component Value Date   NA 137 07/14/2023   K 3.9 07/14/2023   CL 108 07/14/2023   CO2 23 07/14/2023   BUN 5 (L) 07/14/2023   CREATININE 0.85 07/14/2023   GLUCOSE 107 (H) 07/14/2023   GFRNONAA >60 07/14/2023   GFRAA >60 01/20/2020   CALCIUM 9.8 07/14/2023   PROT 7.2 07/12/2023   ALBUMIN 3.8 07/12/2023   BILITOT 1.2 (H) 07/12/2023   ALKPHOS 85 07/12/2023   AST 14 (L) 07/12/2023   ALT 15 07/12/2023   ANIONGAP 6 07/14/2023    CBG (last 3)  No results for input(s): "GLUCAP" in the last 72 hours.    Coagulation Profile: Recent Labs  Lab 07/13/23 0428  INR 1.1     Radiology Studies: I have personally reviewed the imaging studies  DG Abd Portable 1V-Small Bowel Obstruction Protocol-initial, 8 hr delay Result Date: 07/13/2023 CLINICAL DATA:  8 hour delay small-bowel protocol EXAM: PORTABLE ABDOMEN - 1 VIEW COMPARISON:  None Available. FINDINGS: Oral contrast noted throughout the colon. Mildly dilated small bowel in the right lower abdomen measures up to 3.4 cm in diameter. IMPRESSION: Oral contrast within the colon. Electronically Signed   By: Elgie Collard M.D.   On: 07/13/2023 20:00   DG Abd 1 View Result Date: 07/12/2023 CLINICAL DATA:  [id number] Encounter for imaging study to confirm nasogastric (NG) tube placement [id number]. EXAM: ABDOMEN - 1 VIEW COMPARISON:  CT abdomen and pelvis 07/12/2023 FINDINGS: An enteric tube has been placed with tip and side port projecting over the proximal stomach. Surgical clips are noted at the GE junction and in the right upper quadrant. No gross bowel dilatation is seen in the included portion of the abdomen.  There is elevation of the right hemidiaphragm, likely with atelectasis in the lung bases. IMPRESSION: Enteric tube in the proximal stomach. Electronically Signed   By: Sebastian Ache M.D.   On: 07/12/2023 19:11   CT ABDOMEN PELVIS  W CONTRAST Result Date: 07/12/2023 CLINICAL DATA:  Right-sided abdominal pain for 3 weeks, constipation EXAM: CT ABDOMEN AND PELVIS WITH CONTRAST TECHNIQUE: Multidetector CT imaging of the abdomen and pelvis was performed using the standard protocol following bolus administration of intravenous contrast. RADIATION DOSE REDUCTION: This exam was performed according to the departmental dose-optimization program which includes automated exposure control, adjustment of the mA and/or kV according to patient size and/or use of iterative reconstruction technique. CONTRAST:  OMNIPAQUE IOHEXOL 300 MG/ML  SOLN COMPARISON:  01/12/2020 FINDINGS: Lower chest: Scattered areas of bibasilar subsegmental atelectasis or scarring. No acute pleural or parenchymal lung disease. Chronic elevation the right hemidiaphragm. Hepatobiliary: No focal liver abnormality is seen. Status post cholecystectomy. No biliary dilatation. Pancreas: Unremarkable. No pancreatic ductal dilatation or surrounding inflammatory changes. Spleen: Normal in size without focal abnormality. Adrenals/Urinary Tract: Bilateral renal cortical cysts do not require specific imaging follow-up. Otherwise the kidneys enhance normally and symmetrically. No urinary tract calculi or obstruction. The adrenals and bladder are unremarkable. Stomach/Bowel: Postsurgical changes are seen from distal gastrectomy and gastrojejunostomy. There is a dilated segment of mid to distal jejunum within the lower abdomen, measuring up to 3.4 cm in diameter, with scattered gas fluid levels. This could reflect segmental ileus or developing obstruction. Distal small bowel is decompressed. There is no abrupt transition point. Continued radiographic follow-up is  recommended. Normal appendix right lower quadrant. Minimal gas and stool throughout the colon. No bowel wall thickening or inflammatory change. Vascular/Lymphatic: Aortic atherosclerosis. No enlarged abdominal or pelvic lymph nodes. Reproductive: Prostate is enlarged but stable. Other: No free fluid or free intraperitoneal gas. Fat containing left inguinal hernia. No bowel herniation. Musculoskeletal: No acute or destructive bony abnormalities. Reconstructed images demonstrate no additional findings. IMPRESSION: 1. Dilated segment of mid jejunum measuring up to 3.4 cm, which may reflect developing obstruction or localized ileus. Continued radiographic follow-up is recommended. 2.  Aortic Atherosclerosis (ICD10-I70.0). 3. Stable enlarged prostate. 4. Stable fat containing left inguinal hernia. Electronically Signed   By: Sharlet Salina M.D.   On: 07/12/2023 14:38       Amya Hlad M.D. [facility] 07/14/2023, 1:21 PM  Available via Epic secure chat 7am-7pm After 7 pm, please refer to night coverage provider listed on amion.

## 2023-07-14 NOTE — Progress Notes (Signed)
Subjective/Chief Complaint: Pt doing well Having bowel fxn    Objective: Vital signs in last 24 hours: Temp:  [97.9 F (36.6 C)-98.6 F (37 C)] 98.4 F (36.9 C) (12/26 0618) Pulse Rate:  [84-88] 86 (12/26 0618) Resp:  [16-18] 18 (12/26 0618) BP: (139-182)/(81-96) 139/91 (12/26 0618) SpO2:  [95 %-96 %] 95 % (12/26 0618) Last BM Date : 07/13/23  Intake/Output from previous day: 12/25 0701 - 12/26 0700 In: 1480 [P.O.:60; I.V.:1200; NG/GT:120; IV Piggyback:100] Out: 2 [Stool:2] Intake/Output this shift: No intake/output data recorded.  PE:  Constitutional: No acute distress, conversant, appears states age. Eyes: Anicteric sclerae, moist conjunctiva, no lid lag Lungs: Clear to auscultation bilaterally, normal respiratory effort CV: regular rate and rhythm, no murmurs, no peripheral edema, pedal pulses 2+ GI: Soft, no masses or hepatosplenomegaly, non-tender to palpation Skin: No rashes, palpation reveals normal turgor Psychiatric: appropriate judgment and insight, oriented to person, place, and time   Lab Results:  Recent Labs    07/13/23 0428 07/14/23 0436  WBC 9.9 9.4  HGB 13.6 14.7  HCT 41.8 44.4  PLT 181 201   BMET Recent Labs    07/13/23 0428 07/14/23 0436  NA 135 137  K 4.2 3.9  CL 106 108  CO2 23 23  GLUCOSE 128* 107*  BUN 7* 5*  CREATININE 0.78 0.85  CALCIUM 9.3 9.8   PT/INR Recent Labs    07/13/23 0428  LABPROT 14.3  INR 1.1   ABG No results for input(s): "PHART", "HCO3" in the last 72 hours.  Invalid input(s): "PCO2", "PO2"  Studies/Results: DG Abd Portable 1V-Small Bowel Obstruction Protocol-initial, 8 hr delay Result Date: 07/13/2023 CLINICAL DATA:  8 hour delay small-bowel protocol EXAM: PORTABLE ABDOMEN - 1 VIEW COMPARISON:  None Available. FINDINGS: Oral contrast noted throughout the colon. Mildly dilated small bowel in the right lower abdomen measures up to 3.4 cm in diameter. IMPRESSION: Oral contrast within the colon.  Electronically Signed   By: Elgie Collard M.D.   On: 07/13/2023 20:00   DG Abd 1 View Result Date: 07/12/2023 CLINICAL DATA:  [id number] Encounter for imaging study to confirm nasogastric (NG) tube placement [id number]. EXAM: ABDOMEN - 1 VIEW COMPARISON:  CT abdomen and pelvis 07/12/2023 FINDINGS: An enteric tube has been placed with tip and side port projecting over the proximal stomach. Surgical clips are noted at the GE junction and in the right upper quadrant. No gross bowel dilatation is seen in the included portion of the abdomen. There is elevation of the right hemidiaphragm, likely with atelectasis in the lung bases. IMPRESSION: Enteric tube in the proximal stomach. Electronically Signed   By: Sebastian Ache M.D.   On: 07/12/2023 19:11   CT ABDOMEN PELVIS W CONTRAST Result Date: 07/12/2023 CLINICAL DATA:  Right-sided abdominal pain for 3 weeks, constipation EXAM: CT ABDOMEN AND PELVIS WITH CONTRAST TECHNIQUE: Multidetector CT imaging of the abdomen and pelvis was performed using the standard protocol following bolus administration of intravenous contrast. RADIATION DOSE REDUCTION: This exam was performed according to the departmental dose-optimization program which includes automated exposure control, adjustment of the mA and/or kV according to patient size and/or use of iterative reconstruction technique. CONTRAST:  OMNIPAQUE IOHEXOL 300 MG/ML  SOLN COMPARISON:  01/12/2020 FINDINGS: Lower chest: Scattered areas of bibasilar subsegmental atelectasis or scarring. No acute pleural or parenchymal lung disease. Chronic elevation the right hemidiaphragm. Hepatobiliary: No focal liver abnormality is seen. Status post cholecystectomy. No biliary dilatation. Pancreas: Unremarkable. No pancreatic ductal dilatation or surrounding inflammatory  changes. Spleen: Normal in size without focal abnormality. Adrenals/Urinary Tract: Bilateral renal cortical cysts do not require specific imaging follow-up. Otherwise the  kidneys enhance normally and symmetrically. No urinary tract calculi or obstruction. The adrenals and bladder are unremarkable. Stomach/Bowel: Postsurgical changes are seen from distal gastrectomy and gastrojejunostomy. There is a dilated segment of mid to distal jejunum within the lower abdomen, measuring up to 3.4 cm in diameter, with scattered gas fluid levels. This could reflect segmental ileus or developing obstruction. Distal small bowel is decompressed. There is no abrupt transition point. Continued radiographic follow-up is recommended. Normal appendix right lower quadrant. Minimal gas and stool throughout the colon. No bowel wall thickening or inflammatory change. Vascular/Lymphatic: Aortic atherosclerosis. No enlarged abdominal or pelvic lymph nodes. Reproductive: Prostate is enlarged but stable. Other: No free fluid or free intraperitoneal gas. Fat containing left inguinal hernia. No bowel herniation. Musculoskeletal: No acute or destructive bony abnormalities. Reconstructed images demonstrate no additional findings. IMPRESSION: 1. Dilated segment of mid jejunum measuring up to 3.4 cm, which may reflect developing obstruction or localized ileus. Continued radiographic follow-up is recommended. 2.  Aortic Atherosclerosis (ICD10-I70.0). 3. Stable enlarged prostate. 4. Stable fat containing left inguinal hernia. Electronically Signed   By: Sharlet Salina M.D.   On: 07/12/2023 14:38    Anti-infectives: Anti-infectives (From admission, onward)    Start     Dose/Rate Route Frequency Ordered Stop   07/12/23 1500  cefTRIAXone (ROCEPHIN) 1 g in sodium chloride 0.9 % 100 mL IVPB        1 g 200 mL/hr over 30 Minutes Intravenous Every 24 hours 07/12/23 1454         Assessment/Plan: 77 year old male with ileus versus SBO HTN   1. Pt with bowel fxn.  Will DC NGT and start clears 2. Slowly adv diet 3. Will continue to follow.  LOS: 1 day    Axel Filler 07/14/2023

## 2023-07-14 NOTE — Progress Notes (Signed)
   07/14/23 1219  TOC Brief Assessment  Insurance and Status Reviewed  Patient has primary care physician Yes  Home environment has been reviewed Resides at home with spouse  Prior level of function: Independent at baseline  Prior/Current Home Services No current home services  Social Drivers of Health Review SDOH reviewed no interventions necessary  Readmission risk has been reviewed Yes  Transition of care needs no transition of care needs at this time

## 2023-07-14 NOTE — Plan of Care (Signed)
  Problem: Health Behavior/Discharge Planning: Goal: Ability to manage health-related needs will improve Outcome: Progressing   

## 2023-07-15 DIAGNOSIS — R1011 Right upper quadrant pain: Secondary | ICD-10-CM

## 2023-07-15 DIAGNOSIS — K56609 Unspecified intestinal obstruction, unspecified as to partial versus complete obstruction: Secondary | ICD-10-CM | POA: Diagnosis not present

## 2023-07-15 DIAGNOSIS — R112 Nausea with vomiting, unspecified: Secondary | ICD-10-CM

## 2023-07-15 LAB — BASIC METABOLIC PANEL
Anion gap: 8 (ref 5–15)
BUN: 8 mg/dL (ref 8–23)
CO2: 24 mmol/L (ref 22–32)
Calcium: 10 mg/dL (ref 8.9–10.3)
Chloride: 104 mmol/L (ref 98–111)
Creatinine, Ser: 0.81 mg/dL (ref 0.61–1.24)
GFR, Estimated: >60 mL/min (ref >60)
Glucose, Bld: 93 mg/dL (ref 70–99)
Potassium: 3.8 mmol/L (ref 3.5–5.1)
Sodium: 136 mmol/L (ref 135–145)

## 2023-07-15 LAB — CBC
HCT: 42.8 % (ref 39.0–52.0)
Hemoglobin: 14 g/dL (ref 13.0–17.0)
MCH: 27.7 pg (ref 26.0–34.0)
MCHC: 32.7 g/dL (ref 30.0–36.0)
MCV: 84.6 fL (ref 80.0–100.0)
Platelets: 206 10*3/uL (ref 150–400)
RBC: 5.06 MIL/uL (ref 4.22–5.81)
RDW: 14.2 % (ref 11.5–15.5)
WBC: 8.4 10*3/uL (ref 4.0–10.5)
nRBC: 0 % (ref 0.0–0.2)

## 2023-07-15 MED ORDER — SENNOSIDES-DOCUSATE SODIUM 8.6-50 MG PO TABS: 1.0000 | ORAL_TABLET | Freq: Two times a day (BID) | ORAL | Status: AC | PRN

## 2023-07-15 NOTE — Discharge Summary (Signed)
Physician Discharge Summary  ABAS SGRO ZOX:[id number] DOB: 09/23/1945 DOA: 07/12/2023  PCP: [facility]  Admit date: 07/12/2023 Discharge date: 07/15/2023 Admitted From: Home Disposition: Home Recommendations for Outpatient Follow-up:  Follow up with PCP in 1 to 2 weeks Check CMP and CBC at follow-up Please follow up on the following pending results: None  Home Health: No need identified Equipment/Devices: No need identified  Discharge Condition: Stable CODE STATUS: Full code  Follow-up Information     [facility]. Schedule an appointment as soon as possible for a visit in 1 week(s).   Contact information: [address] [facility] [address] [place] [address] [phone]                 Hospital course 77 year old male with prior history of open cholecystectomy in 12/2019, prior gastric ulcer surgery in the past, chronic constipation, BPH, HTN presented to ED with new onset of right-sided abdominal pain for 3 weeks.  No fevers or chills, has chronic constipation.  Patient had 4 episodes of vomiting at home, then presented to ED.  CT abdomen showed bilateral segment of mid jejunum measuring up to 3.4 cm, developing obstruction or localized ileus. NGT was placed and patient was admitted for further workup.  Patient's bowel obstruction resolved clinically and radiologically.  On the day of discharge, he tolerated soft diet and had regular bowel movements.  He was cleared for discharge by general surgery.   See individual problem list below for more.   Problems addressed during this hospitalization SBO (small bowel obstruction) (HCC)-managed with NG tube and bowel rest.  Resolved clinically and radiologically.  Tolerated soft diet. -Bowel regimen to avoid constipation.   Essential hypertension: Normotensive -Continue home meds.   Suspected UTI: UA with possible UTI.  Urine culture showed multiple species.  Patient has no acute UTI  symptoms.  Antibiotics discontinued.  UTI ruled out.   BPH -Continue finasteride   Obesity Body mass index is 41.81 kg/m. -Encourage lifestyle change to lose weight              Time spent 35 minutes  Vital signs Vitals:   07/14/23 0618 07/14/23 1403 07/14/23 2057 07/15/23 0555  BP: (!) 139/91 (!) 150/73 (!) 150/98 115/78  Pulse: 86 87 84 71  Temp: 98.4 F (36.9 C) 98.6 F (37 C) 98.1 F (36.7 C) 97.9 F (36.6 C)  Resp: 18 20 18 18   Height:      Weight:      SpO2: 95% 96% 94% 95%  TempSrc: Oral Oral Oral Oral  BMI (Calculated):         Discharge exam  GENERAL: No apparent distress.  Nontoxic. HEENT: MMM.  Vision and hearing grossly intact.  NECK: Supple.  No apparent JVD.  RESP:  No IWOB.  Fair aeration bilaterally. CVS:  RRR. Heart sounds normal.  ABD/GI/GU: BS+. Abd soft, NTND.  MSK/EXT:  Moves extremities. No apparent deformity. No edema.  SKIN: no apparent skin lesion or wound NEURO: Awake and alert. Oriented appropriately.  No apparent focal neuro deficit. PSYCH: Calm. Normal affect.   Discharge Instructions Discharge Instructions     Diet - low sodium heart healthy   Complete by: As directed    Discharge instructions   Complete by: As directed    It has been a pleasure taking care of you!  You were hospitalized for partial small bowel obstruction that seems to have resolved.  Continue soft diet.  Avoid constipation.  Use stool softener and MiraLAX  as needed for constipation.  Follow-up with your primary care doctor in 1 to 2 weeks or sooner if needed.   Take care,   Increase activity slowly   Complete by: As directed       Allergies as of 07/15/2023   No Known Allergies      Medication List     STOP taking these medications    benzonatate 100 MG capsule Commonly known as: TESSALON       TAKE these medications    Advil 200 MG Caps Generic drug: Ibuprofen Take 200 mg by mouth every 6 (six) hours as needed (for mild pain or  headaches).   amLODipine 10 MG tablet Commonly known as: NORVASC Take 10 mg by mouth daily.   Cholecalciferol 50 MCG (2000 UT) Tabs Take 2,000 Units by mouth daily.   cyanocobalamin 500 MCG tablet Commonly known as: VITAMIN B12 Take 500 mcg by mouth daily.   ferrous sulfate 325 (65 FE) MG tablet Take 325 mg by mouth every Monday, Wednesday, and Friday.   finasteride 5 MG tablet Commonly known as: PROSCAR Take 5 mg by mouth daily.   fluticasone 50 MCG/ACT nasal spray Commonly known as: FLONASE Place 1 spray into both nostrils 2 (two) times daily as needed for rhinitis or allergies.   hydrochlorothiazide 25 MG tablet Commonly known as: HYDRODIURIL Take 25 mg by mouth See admin instructions. Take 25 mg by mouth one to two times a week   lisinopril 10 MG tablet Commonly known as: ZESTRIL Take 10 mg by mouth daily.   naloxone 4 MG/0.1ML Liqd nasal spray kit Commonly known as: NARCAN Place 1 spray into the nose See admin instructions. INSTILL 1 SPRAY INTO ONE NOSTRIL AS DIRECTED FOR OPIOID OVERDOSE - CALL 9-1-1 IMMEDIATELY, ADMINISTER DOSE, THEN TURN PERSON ON SIDE - IF NO RESPONSE IN 2-3 MINUTES OR PERSON RESPONDS BUT RELAPSES, REPEAT USING A NEW SPRAY DEVICE AND SPRAY INTO THE OTHER NOSTRIL   omeprazole 20 MG capsule Commonly known as: PRILOSEC Take 20 mg by mouth daily before breakfast.   polyethylene glycol powder 17 GM/SCOOP powder Commonly known as: GLYCOLAX/MIRALAX Take 17 g by mouth daily as needed for mild constipation (mix as directed).   senna-docusate 8.6-50 MG tablet Commonly known as: Senokot-S Take 1 tablet by mouth 2 (two) times daily between meals as needed for mild constipation.   traMADol 50 MG tablet Commonly known as: ULTRAM Take 50 mg by mouth 2 (two) times daily as needed (for knee pain).   Voltaren Arthritis Pain 1 % Gel Generic drug: diclofenac Sodium Apply 4 g topically 3 (three) times daily as needed (for pain- bilateral knees).         Consultations: General surgery  Procedures/Studies:   DG Abd Portable 1V-Small Bowel Obstruction Protocol-initial, 8 hr delay Result Date: 07/13/2023 CLINICAL DATA:  8 hour delay small-bowel protocol EXAM: PORTABLE ABDOMEN - 1 VIEW COMPARISON:  None Available. FINDINGS: Oral contrast noted throughout the colon. Mildly dilated small bowel in the right lower abdomen measures up to 3.4 cm in diameter. IMPRESSION: Oral contrast within the colon. Electronically Signed   By: Elgie Collard M.D.   On: 07/13/2023 20:00   DG Abd 1 View Result Date: 07/12/2023 CLINICAL DATA:  [id number] Encounter for imaging study to confirm nasogastric (NG) tube placement [id number]. EXAM: ABDOMEN - 1 VIEW COMPARISON:  CT abdomen and pelvis 07/12/2023 FINDINGS: An enteric tube has been placed with tip and side port projecting over the proximal stomach. Surgical clips are noted  at the GE junction and in the right upper quadrant. No gross bowel dilatation is seen in the included portion of the abdomen. There is elevation of the right hemidiaphragm, likely with atelectasis in the lung bases. IMPRESSION: Enteric tube in the proximal stomach. Electronically Signed   By: Sebastian Ache M.D.   On: 07/12/2023 19:11   CT ABDOMEN PELVIS W CONTRAST Result Date: 07/12/2023 CLINICAL DATA:  Right-sided abdominal pain for 3 weeks, constipation EXAM: CT ABDOMEN AND PELVIS WITH CONTRAST TECHNIQUE: Multidetector CT imaging of the abdomen and pelvis was performed using the standard protocol following bolus administration of intravenous contrast. RADIATION DOSE REDUCTION: This exam was performed according to the departmental dose-optimization program which includes automated exposure control, adjustment of the mA and/or kV according to patient size and/or use of iterative reconstruction technique. CONTRAST:  OMNIPAQUE IOHEXOL 300 MG/ML  SOLN COMPARISON:  01/12/2020 FINDINGS: Lower chest: Scattered areas of bibasilar subsegmental atelectasis  or scarring. No acute pleural or parenchymal lung disease. Chronic elevation the right hemidiaphragm. Hepatobiliary: No focal liver abnormality is seen. Status post cholecystectomy. No biliary dilatation. Pancreas: Unremarkable. No pancreatic ductal dilatation or surrounding inflammatory changes. Spleen: Normal in size without focal abnormality. Adrenals/Urinary Tract: Bilateral renal cortical cysts do not require specific imaging follow-up. Otherwise the kidneys enhance normally and symmetrically. No urinary tract calculi or obstruction. The adrenals and bladder are unremarkable. Stomach/Bowel: Postsurgical changes are seen from distal gastrectomy and gastrojejunostomy. There is a dilated segment of mid to distal jejunum within the lower abdomen, measuring up to 3.4 cm in diameter, with scattered gas fluid levels. This could reflect segmental ileus or developing obstruction. Distal small bowel is decompressed. There is no abrupt transition point. Continued radiographic follow-up is recommended. Normal appendix right lower quadrant. Minimal gas and stool throughout the colon. No bowel wall thickening or inflammatory change. Vascular/Lymphatic: Aortic atherosclerosis. No enlarged abdominal or pelvic lymph nodes. Reproductive: Prostate is enlarged but stable. Other: No free fluid or free intraperitoneal gas. Fat containing left inguinal hernia. No bowel herniation. Musculoskeletal: No acute or destructive bony abnormalities. Reconstructed images demonstrate no additional findings. IMPRESSION: 1. Dilated segment of mid jejunum measuring up to 3.4 cm, which may reflect developing obstruction or localized ileus. Continued radiographic follow-up is recommended. 2.  Aortic Atherosclerosis (ICD10-I70.0). 3. Stable enlarged prostate. 4. Stable fat containing left inguinal hernia. Electronically Signed   By: Sharlet Salina M.D.   On: 07/12/2023 14:38       The results of significant diagnostics from this hospitalization  (including imaging, microbiology, ancillary and laboratory) are listed below for reference.     Microbiology: Recent Results (from the past 240 hours)  Urine Culture     Status: Abnormal   Collection Time: 07/12/23 12:50 PM   Specimen: Urine, Random  Result Value Ref Range Status   Specimen Description   Final    URINE, RANDOM Performed at [facility], [address] [place], [place] [address]    Special Requests   Final    NONE Reflexed from [id number] Performed at [facility], [address], [place], [place] [address]    Culture MULTIPLE SPECIES PRESENT, SUGGEST RECOLLECTION (A)  Final   Report Status 07/13/2023 FINAL  Final     Labs:  CBC: Recent Labs  Lab 07/12/23 1155 07/12/23 1903 07/13/23 0428 07/14/23 0436 07/15/23 0423  WBC 9.7 10.5 9.9 9.4 8.4  NEUTROABS 8.1*  --   --   --   --   HGB 14.5  13.3 13.6 14.7 14.0  HCT 44.4 40.5 41.8 44.4 42.8  MCV 83.8 84.6 85.5 84.9 84.6  PLT 214 185 181 201 206   BMP &GFR Recent Labs  Lab 07/12/23 1230 07/12/23 1903 07/13/23 0428 07/14/23 0436 07/15/23 0423  NA 133*  --  135 137 136  K 4.3  --  4.2 3.9 3.8  CL 100  --  106 108 104  CO2 26  --  23 23 24   GLUCOSE 100*  --  128* 107* 93  BUN 10  --  7* 5* 8  CREATININE 0.87 0.86 0.78 0.85 0.81  CALCIUM 9.5  --  9.3 9.8 10.0  MG  --   --  2.0  --   --    Estimated Creatinine Clearance: 101.3 mL/min (by C-G formula based on SCr of 0.81 mg/dL). Liver & Pancreas: Recent Labs  Lab 07/12/23 1230  AST 14*  ALT 15  ALKPHOS 85  BILITOT 1.2*  PROT 7.2  ALBUMIN 3.8   Recent Labs  Lab 07/12/23 1230  LIPASE 24   No results for input(s): "AMMONIA" in the last 168 hours. Diabetic: No results for input(s): "HGBA1C" in the last 72 hours. No results for input(s): "GLUCAP" in the last 168 hours. Cardiac Enzymes: No results for input(s): "CKTOTAL", "CKMB", "CKMBINDEX", "TROPONINI" in the last 168 hours. No results for input(s):  "PROBNP" in the last 8760 hours. Coagulation Profile: Recent Labs  Lab 07/13/23 0428  INR 1.1   Thyroid Function Tests: No results for input(s): "TSH", "T4TOTAL", "FREET4", "T3FREE", "THYROIDAB" in the last 72 hours. Lipid Profile: No results for input(s): "CHOL", "HDL", "LDLCALC", "TRIG", "CHOLHDL", "LDLDIRECT" in the last 72 hours. Anemia Panel: No results for input(s): "VITAMINB12", "FOLATE", "FERRITIN", "TIBC", "IRON", "RETICCTPCT" in the last 72 hours. Urine analysis:    Component Value Date/Time   COLORURINE YELLOW 07/12/2023 1250   APPEARANCEUR CLEAR 07/12/2023 1250   LABSPEC 1.011 07/12/2023 1250   PHURINE 8.0 07/12/2023 1250   GLUCOSEU NEGATIVE 07/12/2023 1250   HGBUR NEGATIVE 07/12/2023 1250   BILIRUBINUR NEGATIVE 07/12/2023 1250   KETONESUR NEGATIVE 07/12/2023 1250   PROTEINUR NEGATIVE 07/12/2023 1250   NITRITE NEGATIVE 07/12/2023 1250   LEUKOCYTESUR LARGE (A) 07/12/2023 1250   Sepsis Labs: Invalid input(s): "PROCALCITONIN", "LACTICIDVEN"   SIGNED:  Almon Hercules, MD  [facility] 07/15/2023, 2:46 PM

## 2023-07-15 NOTE — Plan of Care (Signed)

## 2023-07-15 NOTE — Progress Notes (Signed)
   Subjective/Chief Complaint: Pt doing well, reports flatus and BMs. Denies abdominal pain or nausea.  Asking to go home.   Objective: Vital signs in last 24 hours: Temp:  [97.9 F (36.6 C)-98.6 F (37 C)] 97.9 F (36.6 C) (12/27 0555) Pulse Rate:  [71-87] 71 (12/27 0555) Resp:  [18-20] 18 (12/27 0555) BP: (115-150)/(73-98) 115/78 (12/27 0555) SpO2:  [94 %-96 %] 95 % (12/27 0555) Last BM Date : 07/14/23  Intake/Output from previous day: 12/26 0701 - 12/27 0700 In: 960 [P.O.:960] Out: -  Intake/Output this shift: Total I/O In: 120 [P.O.:120] Out: -   PE:  Constitutional: No acute distress, conversant, appears states age. Eyes: Anicteric sclerae, moist conjunctiva, no lid lag Lungs: normal respiratory effort GI: Soft, no masses or hepatosplenomegaly, non-tender to palpation Skin: No rashes, palpation reveals normal turgor Psychiatric: appropriate judgment and insight, oriented to person, place, and time   Lab Results: Recent Labs    07/14/23 0436 07/15/23 0423  WBC 9.4 8.4  HGB 14.7 14.0  HCT 44.4 42.8  PLT 201 206   BMET Recent Labs    07/14/23 0436 07/15/23 0423  NA 137 136  K 3.9 3.8  CL 108 104  CO2 23 24  GLUCOSE 107* 93  BUN 5* 8  CREATININE 0.85 0.81  CALCIUM 9.8 10.0   PT/INR Recent Labs    07/13/23 0428  LABPROT 14.3  INR 1.1   ABG No results for input(s): "PHART", "HCO3" in the last 72 hours.  Invalid input(s): "PCO2", "PO2"  Studies/Results: DG Abd Portable 1V-Small Bowel Obstruction Protocol-initial, 8 hr delay Result Date: 07/13/2023 CLINICAL DATA:  8 hour delay small-bowel protocol EXAM: PORTABLE ABDOMEN - 1 VIEW COMPARISON:  None Available. FINDINGS: Oral contrast noted throughout the colon. Mildly dilated small bowel in the right lower abdomen measures up to 3.4 cm in diameter. IMPRESSION: Oral contrast within the colon. Electronically Signed   By: Elgie Collard M.D.   On: 07/13/2023 20:00     Anti-infectives: Anti-infectives (From admission, onward)    Start     Dose/Rate Route Frequency Ordered Stop   07/12/23 1500  cefTRIAXone (ROCEPHIN) 1 g in sodium chloride 0.9 % 100 mL IVPB  Status:  Discontinued        1 g 200 mL/hr over 30 Minutes Intravenous Every 24 hours 07/12/23 1454 07/14/23 1324       Assessment/Plan: 77 year old male with ileus versus SBO HTN  AFVSS SBO protocol 12/25 >> contrast in colon. Clinically SBO resolved, +BMs. Advance to soft diet Ok for discharge from [facility] standpoint     LOS: 2 days    Adam Phenix 07/15/2023

## 2023-07-15 NOTE — Evaluation (Signed)
Physical Therapy Evaluation Patient Details Name: David Nunez MRN: [id number] DOB: 1946-06-29 Today's Date: 07/15/2023  History of Present Illness  Pt is 77 yo male admitted on 07/12/23 with ileus vs SBO managed conservatively and resolved.  Pt with hx including but not limited to TKA, cholecystectomy, BPH, HTN, gastric ulcer  Clinical Impression  Pt admitted with above diagnosis.  He demonstrate transfers and ambulation safely at independent level.  Pt has support at home and necessary DME.  No further PT needs.         If plan is discharge home, recommend the following:     Can travel by private vehicle        Equipment Recommendations None recommended by PT  Recommendations for Other Services       Functional Status Assessment Patient has not had a recent decline in their functional status     Precautions / Restrictions Precautions Precautions: None      Mobility  Bed Mobility Overal bed mobility: Independent                  Transfers Overall transfer level: Independent                 General transfer comment: Has been mobilizing in room on his own.  Demonstrated safely    Ambulation/Gait Ambulation/Gait assistance: Independent Gait Distance (Feet): 200 Feet Assistive device: None Gait Pattern/deviations: WFL(Within Functional Limits) Gait velocity: normal     General Gait Details: Has been mobilizing in room independently.  Demonstrated safely and at baseline with PT  Stairs            Wheelchair Mobility     Tilt Bed    Modified Rankin (Stroke Patients Only)       Balance Overall balance assessment: Independent                                           Pertinent Vitals/Pain Pain Assessment Pain Assessment: No/denies pain    Home Living Family/patient expects to be discharged to:: Private residence Living Arrangements: Spouse/significant other Available Help at Discharge: Family;Available  PRN/intermittently Type of Home: House Home Access: Level entry       Home Layout: One level Home Equipment: Cane - single point      Prior Function Prior Level of Function : Independent/Modified Independent;Driving             Mobility Comments: Could ambulate in community ADLs Comments: independent adls and iadls     Extremity/Trunk Assessment   Upper Extremity Assessment Upper Extremity Assessment: Overall WFL for tasks assessed    Lower Extremity Assessment Lower Extremity Assessment: Overall WFL for tasks assessed    Cervical / Trunk Assessment Cervical / Trunk Assessment: Normal  Communication      Cognition Arousal: Alert Behavior During Therapy: WFL for tasks assessed/performed Overall Cognitive Status: Within Functional Limits for tasks assessed                                          General Comments      Exercises     Assessment/Plan    PT Assessment Patient does not need any further PT services  PT Problem List         PT Treatment Interventions  PT Goals (Current goals can be found in the Care Plan section)  Acute Rehab PT Goals Patient Stated Goal: return home; eat a meal PT Goal Formulation: All assessment and education complete, DC therapy    Frequency       Co-evaluation               AM-PAC PT "6 Clicks" Mobility  Outcome Measure Help needed turning from your back to your side while in a flat bed without using bedrails?: None Help needed moving from lying on your back to sitting on the side of a flat bed without using bedrails?: None Help needed moving to and from a bed to a chair (including a wheelchair)?: None Help needed standing up from a chair using your arms (e.g., wheelchair or bedside chair)?: None Help needed to walk in hospital room?: None Help needed climbing 3-5 steps with a railing? : None 6 Click Score: 24    End of Session   Activity Tolerance: Patient tolerated treatment  well Patient left: in chair;with call bell/phone within reach Nurse Communication: Mobility status PT Visit Diagnosis: Other abnormalities of gait and mobility (R26.89)    Time: 0912-0921 PT Time Calculation (min) (ACUTE ONLY): 9 min   Charges:   PT Evaluation $PT Eval Low Complexity: 1 Low   PT General Charges $$ ACUTE PT VISIT: 1 Visit         Anise Salvo, PT Acute Rehab Services [facility] [phone]   Rayetta Humphrey 07/15/2023, 9:26 AM
